# Patient Record
Sex: Male | Born: 1950 | Race: White | Hispanic: No | Smoking: Current every day smoker
Health system: Southern US, Community
[De-identification: ages and names within clinical notes are randomized; demographics above are authoritative.]

## PROBLEM LIST (undated history)

## (undated) DIAGNOSIS — K921 Melena: Secondary | ICD-10-CM

## (undated) DIAGNOSIS — R51 Headache: Secondary | ICD-10-CM

## (undated) DIAGNOSIS — M545 Low back pain, unspecified: Secondary | ICD-10-CM

## (undated) DIAGNOSIS — I82C19 Acute embolism and thrombosis of unspecified internal jugular vein: Secondary | ICD-10-CM

## (undated) DIAGNOSIS — C4492 Squamous cell carcinoma of skin, unspecified: Secondary | ICD-10-CM

## (undated) DIAGNOSIS — K279 Peptic ulcer, site unspecified, unspecified as acute or chronic, without hemorrhage or perforation: Secondary | ICD-10-CM

## (undated) DIAGNOSIS — J189 Pneumonia, unspecified organism: Secondary | ICD-10-CM

## (undated) DIAGNOSIS — G8929 Other chronic pain: Secondary | ICD-10-CM

## (undated) DIAGNOSIS — K219 Gastro-esophageal reflux disease without esophagitis: Secondary | ICD-10-CM

## (undated) DIAGNOSIS — R011 Cardiac murmur, unspecified: Secondary | ICD-10-CM

## (undated) DIAGNOSIS — F329 Major depressive disorder, single episode, unspecified: Secondary | ICD-10-CM

## (undated) DIAGNOSIS — M1990 Unspecified osteoarthritis, unspecified site: Secondary | ICD-10-CM

## (undated) DIAGNOSIS — R519 Headache, unspecified: Secondary | ICD-10-CM

## (undated) DIAGNOSIS — F32A Depression, unspecified: Secondary | ICD-10-CM

## (undated) DIAGNOSIS — H9313 Tinnitus, bilateral: Secondary | ICD-10-CM

## (undated) DIAGNOSIS — F419 Anxiety disorder, unspecified: Secondary | ICD-10-CM

## (undated) HISTORY — PX: CATARACT EXTRACTION W/ INTRAOCULAR LENS IMPLANT: SHX1309

## (undated) HISTORY — DX: Peptic ulcer, site unspecified, unspecified as acute or chronic, without hemorrhage or perforation: K27.9

---

## 2017-12-18 HISTORY — PX: SKIN BIOPSY: SHX1

## 2018-11-29 ENCOUNTER — Encounter (HOSPITAL_COMMUNITY): Payer: Self-pay

## 2018-11-29 ENCOUNTER — Other Ambulatory Visit: Payer: Self-pay

## 2018-11-29 ENCOUNTER — Inpatient Hospital Stay (HOSPITAL_COMMUNITY)
Admission: EM | Admit: 2018-11-29 | Discharge: 2018-12-03 | DRG: 133 | Disposition: A | Discharge status: Home / Self Care | Payer: Medicare HMO | Attending: Internal Medicine | Admitting: Internal Medicine

## 2018-11-29 ENCOUNTER — Emergency Department (HOSPITAL_COMMUNITY): Payer: Medicare HMO

## 2018-11-29 DIAGNOSIS — F17210 Nicotine dependence, cigarettes, uncomplicated: Secondary | ICD-10-CM | POA: Diagnosis present

## 2018-11-29 DIAGNOSIS — Z791 Long term (current) use of non-steroidal anti-inflammatories (NSAID): Secondary | ICD-10-CM

## 2018-11-29 DIAGNOSIS — F419 Anxiety disorder, unspecified: Secondary | ICD-10-CM | POA: Diagnosis present

## 2018-11-29 DIAGNOSIS — Z7982 Long term (current) use of aspirin: Secondary | ICD-10-CM

## 2018-11-29 DIAGNOSIS — C760 Malignant neoplasm of head, face and neck: Principal | ICD-10-CM | POA: Diagnosis present

## 2018-11-29 DIAGNOSIS — I82C11 Acute embolism and thrombosis of right internal jugular vein: Secondary | ICD-10-CM | POA: Diagnosis present

## 2018-11-29 DIAGNOSIS — R221 Localized swelling, mass and lump, neck: Secondary | ICD-10-CM | POA: Diagnosis present

## 2018-11-29 DIAGNOSIS — Z716 Tobacco abuse counseling: Secondary | ICD-10-CM

## 2018-11-29 DIAGNOSIS — F411 Generalized anxiety disorder: Secondary | ICD-10-CM | POA: Diagnosis present

## 2018-11-29 DIAGNOSIS — F329 Major depressive disorder, single episode, unspecified: Secondary | ICD-10-CM | POA: Diagnosis present

## 2018-11-29 DIAGNOSIS — Z720 Tobacco use: Secondary | ICD-10-CM | POA: Diagnosis present

## 2018-11-29 DIAGNOSIS — E876 Hypokalemia: Secondary | ICD-10-CM | POA: Diagnosis present

## 2018-11-29 DIAGNOSIS — R011 Cardiac murmur, unspecified: Secondary | ICD-10-CM | POA: Diagnosis present

## 2018-11-29 HISTORY — DX: Depression, unspecified: F32.A

## 2018-11-29 HISTORY — DX: Anxiety disorder, unspecified: F41.9

## 2018-11-29 HISTORY — DX: Major depressive disorder, single episode, unspecified: F32.9

## 2018-11-29 HISTORY — DX: Cardiac murmur, unspecified: R01.1

## 2018-11-29 LAB — CBC WITH DIFFERENTIAL/PLATELET
Abs Immature Granulocytes: 0.04 10*3/uL (ref 0.00–0.07)
Basophils Absolute: 0 10*3/uL (ref 0.0–0.1)
Basophils Relative: 0 %
Eosinophils Absolute: 0.5 10*3/uL (ref 0.0–0.5)
Eosinophils Relative: 4 %
HCT: 49.5 % (ref 39.0–52.0)
Hemoglobin: 16.3 g/dL (ref 13.0–17.0)
Immature Granulocytes: 0 %
Lymphocytes Relative: 22 %
Lymphs Abs: 2.6 10*3/uL (ref 0.7–4.0)
MCH: 29.5 pg (ref 26.0–34.0)
MCHC: 32.9 g/dL (ref 30.0–36.0)
MCV: 89.5 fL (ref 80.0–100.0)
Monocytes Absolute: 0.9 10*3/uL (ref 0.1–1.0)
Monocytes Relative: 8 %
Neutro Abs: 7.7 10*3/uL (ref 1.7–7.7)
Neutrophils Relative %: 66 %
PLATELETS: 319 10*3/uL (ref 150–400)
RBC: 5.53 MIL/uL (ref 4.22–5.81)
RDW: 13.5 % (ref 11.5–15.5)
WBC: 11.8 10*3/uL — AB (ref 4.0–10.5)
nRBC: 0 % (ref 0.0–0.2)

## 2018-11-29 LAB — BASIC METABOLIC PANEL
Anion gap: 11 (ref 5–15)
BUN: 13 mg/dL (ref 8–23)
CO2: 25 mmol/L (ref 22–32)
Calcium: 9.3 mg/dL (ref 8.9–10.3)
Chloride: 101 mmol/L (ref 98–111)
Creatinine, Ser: 1 mg/dL (ref 0.61–1.24)
GFR calc Af Amer: >60 mL/min (ref >60)
GFR calc non Af Amer: >60 mL/min (ref >60)
Glucose, Bld: 96 mg/dL (ref 70–99)
Potassium: 3.4 mmol/L — ABNORMAL LOW (ref 3.5–5.1)
Sodium: 137 mmol/L (ref 135–145)

## 2018-11-29 MED ORDER — IOPAMIDOL (ISOVUE-300) INJECTION 61%
75.0000 mL | Freq: Once | INTRAVENOUS | Status: AC | PRN
  Administered 2018-11-29: 75 mL via INTRAVENOUS

## 2018-11-29 NOTE — ED Triage Notes (Signed)
Pt reports "cleaning his ears with peroxide" about 3 weeks ago, afterwards started having right ear pain, lymph nodes below ear are swollen.

## 2018-11-29 NOTE — ED Provider Notes (Signed)
[facility] EMERGENCY DEPARTMENT Provider Note   CSN: [id number] Arrival date & time: 11/29/18  1922     History   Chief Complaint Chief Complaint  Patient presents with  . Otalgia    right lymphnode swollen    HPI Miguel Moreno is a 67 y.o. male.  HPI Patient presents with 3 weeks of right sided submandibular mass.  States the mass is tender to palpation.  Believes it may be related to cleaning his ear with peroxide several weeks ago.  He denies any fever or chills.  Denies shortness of breath or difficulty swallowing.  States he still feels like he has fluid in his ear but denies any pain. Past Medical History:  Diagnosis Date  . Anxiety and depression   . Heart murmur     Patient Active Problem List   Diagnosis Date Noted  . Neck mass 11/30/2018  . Tobacco abuse 11/30/2018  . Anxiety state 11/30/2018    Past Surgical History:  Procedure Laterality Date  . CATARACT EXTRACTION          Home Medications    Prior to Admission medications   Medication Sig Start Date End Date Taking? Authorizing Provider  aspirin EC 81 MG tablet Take 81 mg by mouth daily.   Yes [provider]  naproxen sodium (ALEVE) 220 MG tablet Take 220 mg by mouth.   Yes [provider]    Family History No family history on file.  Social History Social History   Tobacco Use  . Smoking status: Current Every Day Smoker    Packs/day: 1.00    Years: 20.00    Pack years: 20.00    Types: Cigarettes  . Smokeless tobacco: Never Used  Substance Use Topics  . Alcohol use: Never    Frequency: Never  . Drug use: Never     Allergies   Patient has no known allergies.   Review of Systems Review of Systems  Constitutional: Negative for chills and fever.  HENT: Negative for facial swelling, sore throat, trouble swallowing and voice change.   Respiratory: Negative for cough, shortness of breath, wheezing and stridor.   Musculoskeletal: Positive for neck pain. Negative  for neck stiffness.  Skin: Negative for rash and wound.  Neurological: Negative for weakness, numbness and headaches.  All other systems reviewed and are negative.    Physical Exam Updated Vital Signs BP 119/69 (BP Location: Left Arm)   Pulse 67   Temp 97.7 F (36.5 C) (Oral)   Resp 11   Ht 5\' 4"  (1.626 m)   Wt 75.2 kg   SpO2 97%   BMI 28.46 kg/m   Physical Exam Vitals signs and nursing note reviewed.  Constitutional:      General: He is not in acute distress.    Appearance: Normal appearance. He is well-developed and normal weight. He is not ill-appearing.  HENT:     Head: Normocephalic and atraumatic.     Right Ear: External ear normal. There is no impacted cerumen.     Left Ear: External ear normal. There is no impacted cerumen.     Ears:     Comments: Normal pinna, external canals.  Mild bulging of TMs bilaterally.  No posterior auricular tenderness to palpation. Eyes:     Pupils: Pupils are equal, round, and reactive to light.  Neck:     Musculoskeletal: Normal range of motion and neck supple.     Comments: Right submandibular mass noted just anterior to the angle of  the mandible.  No fluctuance.  No erythema.  Mild tenderness to palpation. Cardiovascular:     Rate and Rhythm: Normal rate and regular rhythm.  Pulmonary:     Effort: Pulmonary effort is normal.     Breath sounds: Normal breath sounds.  Abdominal:     General: Bowel sounds are normal.     Palpations: Abdomen is soft.     Tenderness: There is no abdominal tenderness. There is no guarding or rebound.  Musculoskeletal: Normal range of motion.        General: No tenderness.  Skin:    General: Skin is warm and dry.     Findings: No erythema or rash.  Neurological:     Mental Status: He is alert and oriented to person, place, and time.  Psychiatric:        Behavior: Behavior normal.      ED Treatments / Results  Labs (all labs ordered are listed, but only abnormal results are displayed) Labs  Reviewed  BASIC METABOLIC PANEL - Abnormal; Notable for the following components:      Result Value   Potassium 3.4 (*)    All other components within normal limits  CBC WITH DIFFERENTIAL/PLATELET - Abnormal; Notable for the following components:   WBC 11.8 (*)    All other components within normal limits  APTT - Abnormal; Notable for the following components:   aPTT 39 (*)    All other components within normal limits  CBC - Abnormal; Notable for the following components:   WBC 11.7 (*)    All other components within normal limits  COMPREHENSIVE METABOLIC PANEL - Abnormal; Notable for the following components:   Calcium 8.7 (*)    AST 14 (*)    All other components within normal limits  PROTIME-INR  HEPARIN LEVEL (UNFRACTIONATED)  HIV ANTIBODY (ROUTINE TESTING W REFLEX)  HEPARIN LEVEL (UNFRACTIONATED)  HEPARIN LEVEL (UNFRACTIONATED)  CBC    EKG None  Radiology Ct Soft Tissue Neck W Contrast  Result Date: 11/29/2018 CLINICAL DATA:  RIGHT ear pain and RIGHT lymphadenopathy after cleaning ear with peroxide 3 weeks ago. EXAM: CT NECK WITH CONTRAST TECHNIQUE: Multidetector CT imaging of the neck was performed using the standard protocol following the bolus administration of intravenous contrast. CONTRAST:  68mL ISOVUE-300 IOPAMIDOL (ISOVUE-300) INJECTION 61% COMPARISON:  None. FINDINGS: PHARYNX AND LARYNX: Isodense 17 mm nodule suspected RIGHT palatine tonsil (axial image 38, coronal image 49). Widely patent airway. SALIVARY GLANDS: Normal. THYROID: Subcentimeter calcification RIGHT thyroid lobe, thyroid is otherwise unremarkable. LYMPH NODES: RIGHT level 2 necrotic/cystic nodal conglomeration measuring to 3.4 x 3.9 cm, Dominant lymph node measuring 2.2 x 3.1 cm. LEFT level 2 B cystic 11 mm and LEFT level 3 12 mm lymph nodes. Additional smaller LEFT neck lymph nodes. VASCULAR: RIGHT internal jugular vein distension with central filling defect at level of mid neck associated with  lymphadenopathy. Mild calcific atherosclerosis carotid bifurcations. LIMITED INTRACRANIAL: Normal. VISUALIZED ORBITS: LEFT ocular lens implant. MASTOIDS AND VISUALIZED PARANASAL SINUSES: Mild paranasal sinus mucosal thickening without air-fluid levels. SKELETON: Nonacute. Patient is edentulous. Mild RIGHT temporomandibular osteoarthrosis. Severe lower cervical spondylosis. Moderate cervical facet arthropathy. UPPER CHEST: Lung apices are clear. Centrilobular emphysema. No superior mediastinal lymphadenopathy. OTHER: None. IMPRESSION: 1. Cystic/necrotic RIGHT > LEFT neck lymphadenopathy consistent with metastatic disease. 2. 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 Recommend ENT consultation. 3. Acute RIGHT internal jugular vein thrombosis related to lymphadenopathy. 4. Acute findings discussed with and  reconfirmed by Dr.Doniesha Landau on 11/29/2018 at 10:18 pm. Aortic Atherosclerosis (ICD10-I70.0). Electronically Signed   By: Elon Alas M.D.   On: 11/29/2018 22:18    Procedures Procedures (including critical care time)  Medications Ordered in ED Medications  0.9 %  sodium chloride infusion (has no administration in time range)  acetaminophen (TYLENOL) tablet 650 mg (650 mg Oral Given 12/01/18 0839)    Or  acetaminophen (TYLENOL) suppository 650 mg ( Rectal See Alternative 12/01/18 0839)  ondansetron (ZOFRAN) tablet 4 mg (has no administration in time range)    Or  ondansetron (ZOFRAN) injection 4 mg (has no administration in time range)  heparin ADULT infusion 100 units/mL (25000 units/229mL sodium chloride 0.45%) (1,250 Units/hr Intravenous New Bag/Given 12/01/18 1503)  feeding supplement (ENSURE ENLIVE) (ENSURE ENLIVE) liquid 237 mL (237 mLs Oral Given 11/30/18 2034)  multivitamin liquid 15 mL (15 mLs Oral Given 12/01/18 0839)  iopamidol (ISOVUE-300) 61 % injection 75 mL (75 mLs Intravenous Contrast Given 11/29/18 2145)    potassium chloride SA (K-DUR,KLOR-CON) CR tablet 20 mEq (20 mEq Oral Given 11/30/18 0023)  heparin bolus via infusion 4,000 Units (4,000 Units Intravenous Bolus from Bag 11/30/18 0104)     Initial Impression / Assessment and Plan / ED Course  I have reviewed the triage vital signs and the nursing notes.  Pertinent labs & imaging results that were available during my care of the patient were reviewed by me and considered in my medical decision making (see chart for details).    Abnormal lymph nodes on CT.  Patient also has an internal jugular thrombus on the right.  Discussed with oncology who recommend starting heparin and admission for possible biopsy.  Hospitalist will admit and transfer to [facility].   Final Clinical Impressions(s) / ED Diagnoses   Final diagnoses:  Neck mass    ED Discharge Orders    None       Julianne Rice, MD 12/01/18 1527

## 2018-11-30 DIAGNOSIS — Z716 Tobacco abuse counseling: Secondary | ICD-10-CM | POA: Diagnosis not present

## 2018-11-30 DIAGNOSIS — Z791 Long term (current) use of non-steroidal anti-inflammatories (NSAID): Secondary | ICD-10-CM | POA: Diagnosis not present

## 2018-11-30 DIAGNOSIS — E876 Hypokalemia: Secondary | ICD-10-CM | POA: Diagnosis not present

## 2018-11-30 DIAGNOSIS — I8290 Acute embolism and thrombosis of unspecified vein: Secondary | ICD-10-CM

## 2018-11-30 DIAGNOSIS — F411 Generalized anxiety disorder: Secondary | ICD-10-CM | POA: Diagnosis present

## 2018-11-30 DIAGNOSIS — F329 Major depressive disorder, single episode, unspecified: Secondary | ICD-10-CM | POA: Diagnosis not present

## 2018-11-30 DIAGNOSIS — R221 Localized swelling, mass and lump, neck: Secondary | ICD-10-CM | POA: Diagnosis not present

## 2018-11-30 DIAGNOSIS — F17210 Nicotine dependence, cigarettes, uncomplicated: Secondary | ICD-10-CM | POA: Diagnosis not present

## 2018-11-30 DIAGNOSIS — F419 Anxiety disorder, unspecified: Secondary | ICD-10-CM | POA: Diagnosis not present

## 2018-11-30 DIAGNOSIS — I82C11 Acute embolism and thrombosis of right internal jugular vein: Secondary | ICD-10-CM | POA: Diagnosis not present

## 2018-11-30 DIAGNOSIS — Z720 Tobacco use: Secondary | ICD-10-CM | POA: Diagnosis present

## 2018-11-30 DIAGNOSIS — C760 Malignant neoplasm of head, face and neck: Secondary | ICD-10-CM | POA: Diagnosis present

## 2018-11-30 DIAGNOSIS — Z7982 Long term (current) use of aspirin: Secondary | ICD-10-CM | POA: Diagnosis not present

## 2018-11-30 DIAGNOSIS — R011 Cardiac murmur, unspecified: Secondary | ICD-10-CM | POA: Diagnosis not present

## 2018-11-30 LAB — HEPARIN LEVEL (UNFRACTIONATED)
Heparin Unfractionated: 0.65 IU/mL (ref 0.30–0.70)
Heparin Unfractionated: 0.54 IU/mL (ref 0.30–0.70)

## 2018-11-30 LAB — COMPREHENSIVE METABOLIC PANEL
ALT: 10 U/L (ref 0–44)
AST: 14 U/L — AB (ref 15–41)
Albumin: 3.7 g/dL (ref 3.5–5.0)
Alkaline Phosphatase: 56 U/L (ref 38–126)
Anion gap: 11 (ref 5–15)
BUN: 13 mg/dL (ref 8–23)
CO2: 23 mmol/L (ref 22–32)
Calcium: 8.7 mg/dL — ABNORMAL LOW (ref 8.9–10.3)
Chloride: 104 mmol/L (ref 98–111)
Creatinine, Ser: 0.96 mg/dL (ref 0.61–1.24)
GFR calc Af Amer: >60 mL/min (ref >60)
GFR calc non Af Amer: >60 mL/min (ref >60)
Glucose, Bld: 95 mg/dL (ref 70–99)
Potassium: 4.2 mmol/L (ref 3.5–5.1)
Sodium: 138 mmol/L (ref 135–145)
Total Bilirubin: 1 mg/dL (ref 0.3–1.2)
Total Protein: 6.6 g/dL (ref 6.5–8.1)

## 2018-11-30 LAB — CBC
HCT: 44 % (ref 39.0–52.0)
Hemoglobin: 14.8 g/dL (ref 13.0–17.0)
MCH: 29.7 pg (ref 26.0–34.0)
MCHC: 33.6 g/dL (ref 30.0–36.0)
MCV: 88.2 fL (ref 80.0–100.0)
Platelets: 272 10*3/uL (ref 150–400)
RBC: 4.99 MIL/uL (ref 4.22–5.81)
RDW: 13.4 % (ref 11.5–15.5)
WBC: 11.7 10*3/uL — ABNORMAL HIGH (ref 4.0–10.5)
nRBC: 0 % (ref 0.0–0.2)

## 2018-11-30 LAB — APTT: aPTT: 39 seconds — ABNORMAL HIGH (ref 24–36)

## 2018-11-30 LAB — PROTIME-INR
INR: 1
PROTHROMBIN TIME: 13.1 s (ref 11.4–15.2)

## 2018-11-30 LAB — HIV ANTIBODY (ROUTINE TESTING W REFLEX): HIV Screen 4th Generation wRfx: NONREACTIVE

## 2018-11-30 MED ORDER — ADULT MULTIVITAMIN LIQUID CH
15.0000 mL | Freq: Every day | ORAL | Status: DC
  Administered 2018-11-30 – 2018-12-01 (×2): 15 mL via ORAL
  Filled 2018-11-30 (×4): qty 15

## 2018-11-30 MED ORDER — POTASSIUM CHLORIDE CRYS ER 20 MEQ PO TBCR
20.0000 meq | EXTENDED_RELEASE_TABLET | Freq: Once | ORAL | Status: AC
  Administered 2018-11-30: 20 meq via ORAL
  Filled 2018-11-30: qty 1

## 2018-11-30 MED ORDER — HEPARIN (PORCINE) 25000 UT/250ML-% IV SOLN
1250.0000 [IU]/h | INTRAVENOUS | Status: AC
  Administered 2018-11-30 – 2018-12-02 (×4): 1250 [IU]/h via INTRAVENOUS
  Filled 2018-11-30 (×6): qty 250

## 2018-11-30 MED ORDER — ACETAMINOPHEN 650 MG RE SUPP: 650.0000 mg | Freq: Four times a day (QID) | RECTAL | Status: DC | PRN

## 2018-11-30 MED ORDER — ONDANSETRON HCL 4 MG PO TABS: 4.0000 mg | ORAL_TABLET | Freq: Four times a day (QID) | ORAL | Status: DC | PRN

## 2018-11-30 MED ORDER — SODIUM CHLORIDE 0.9 % IV SOLN: INTRAVENOUS | Status: DC

## 2018-11-30 MED ORDER — ONDANSETRON HCL 4 MG/2ML IJ SOLN: 4.0000 mg | Freq: Four times a day (QID) | INTRAMUSCULAR | Status: DC | PRN

## 2018-11-30 MED ORDER — HEPARIN BOLUS VIA INFUSION
4000.0000 [IU] | Freq: Once | INTRAVENOUS | Status: AC
  Administered 2018-11-30: 4000 [IU] via INTRAVENOUS

## 2018-11-30 MED ORDER — ACETAMINOPHEN 325 MG PO TABS
650.0000 mg | ORAL_TABLET | Freq: Four times a day (QID) | ORAL | Status: DC | PRN
  Administered 2018-11-30 – 2018-12-03 (×6): 650 mg via ORAL
  Filled 2018-11-30 (×6): qty 2

## 2018-11-30 MED ORDER — ENSURE ENLIVE PO LIQD
237.0000 mL | ORAL | Status: DC
  Administered 2018-11-30 – 2018-12-02 (×3): 237 mL via ORAL

## 2018-11-30 NOTE — Progress Notes (Signed)
Initial Nutrition Assessment  DOCUMENTATION CODES:  Not applicable  INTERVENTION:  Ensure Enlive Q 24 hrs, each supplement provides 350 kcal and 20 grams of protein   MVI with minerals-liquid  NUTRITION DIAGNOSIS:  Increased nutrient needs related to cancer and cancer related treatments as evidenced by estimated nutritional requirements for this condition  GOAL:  Patient will meet greater than or equal to 90% of their needs   MONITOR:  PO intake, Supplement acceptance, Labs, I & O's, Weight trends   REASON FOR ASSESSMENT:  Malnutrition Screening Tool    ASSESSMENT:  67 y/o male PMhx anxiety, depression. Prsented to [facility] with R side neck mass w/ odynophagia. CT scan revealed cystic/necrotic adenopathy of neck consistent w/ metastatic disease suspicious for primary H&N cancer. Transferred to [facility] for biopsy of mass.   Pt reports that he developed symptoms roughly 2 weeks ago. These included ear pain and some mild odynophagia, the latter of which he says has resolved. He denies any changes in intake related to these symptoms.   At baseline, he does not follow any therapeutic diet. He does avoid spicy foods for reflux reasons. He does not drink oral supplements. He used to take a mvi, but stopped several months ago. He says he needs one because he can feel that his vitamin levels are low. He asks for a liquid instead of a pill because he believes these work quicker  He reported on MST screen that he had lost a large amount of weight. However, today he says his weight loss is in the distant past and says his wt has been roughly stable for the past 2 years. He says he may even be up ~5 lbs recently.   Currently, pt reports a good appetite. His dinner tray at bedside is 95% eaten. Given heightened needs in setting of malignancy, RD recommend a protein supplement. He agreed to Ensure. Will order.   Labs: wbc:11.7 Meds: IVF  Recent Labs  Lab 11/29/18 2012 11/30/18 0728  NA 137 138  K  3.4* 4.2  CL 101 104  CO2 25 23  BUN 13 13  CREATININE 1.00 0.96  CALCIUM 9.3 8.7*  GLUCOSE 96 95   NUTRITION - FOCUSED PHYSICAL EXAM:   Most Recent Value  Orbital Region  No depletion  Upper Arm Region  No depletion  Thoracic and Lumbar Region  No depletion  Buccal Region  No depletion  Temple Region  No depletion  Clavicle Bone Region  No depletion  Clavicle and Acromion Bone Region  No depletion  Scapular Bone Region  Unable to assess  Dorsal Hand  No depletion  Patellar Region  No depletion  Anterior Thigh Region  No depletion  Posterior Calf Region  No depletion  Edema (RD Assessment)  None  Hair  Reviewed  Eyes  Reviewed  Mouth  Reviewed  Skin  Reviewed  Nails  Reviewed       Diet Order:   Diet Order            Diet regular Room service appropriate? Yes; Fluid consistency: Thin  Diet effective now             EDUCATION NEEDS:  Not appropriate for education at this time  Skin:  Skin Assessment: Reviewed RN Assessment  Last BM:  11/29/2018  Height:  Ht Readings from Last 1 Encounters:  11/29/18 5\' 4"  (1.626 m)   Weight:  Wt Readings from Last 1 Encounters:  11/30/18 75.2 kg   Ideal Body Weight:  59.1 kg  BMI:  Body mass index is 28.46 kg/m.  Estimated Nutritional Needs:  Kcal:  1900-2050 (25-27 kcal/kg bw) Protein:  90-105g Pro (1.2-1.4 g/kg bw) Fluid:  1.9-2.1 L fluid (77ml/kca)  Burtis Junes RD, LDN, CNSC Clinical Nutrition Available Tues-Sat via Pager: [phone] 11/30/2018 6:45 PM

## 2018-11-30 NOTE — Progress Notes (Signed)
Patient is NPO. MD was notified and asked writer to contact IR.  Attempt x4 to contact IR was unsuccessful, phone rings and goes to voicemail. Patient is not on the schedule for any procedure at this time. Regular diet initiated.

## 2018-11-30 NOTE — Plan of Care (Signed)
  Problem: Education: Goal: Knowledge of General Education information will improve Description: Including pain rating scale, medication(s)/side effects and non-pharmacologic comfort measures Outcome: Progressing   Problem: Pain Managment: Goal: General experience of comfort will improve Outcome: Progressing   Problem: Safety: Goal: Ability to remain free from injury will improve Outcome: Progressing   

## 2018-11-30 NOTE — H&P (Signed)
[facility] H&P    Patient Demographics:    Miguel Moreno, is a 67 y.o. male  MRN: [id number]  DOB - [birth year]  Admit Date - 11/29/2018  Referring MD/NP/PA: Dr. Lita Mains  Outpatient Primary MD for the patient is Patient, No Pcp Per  Patient coming from: Home  Chief complaint-neck swelling   HPI:    Miguel Moreno  is a 67 y.o. male, with history of anxiety and depression, heart murmur came to the hospital with complaints of right-sided neck mass.  Patient says that he has experienced pain during swallowing.  Denies shortness of breath.  No fever or chills.  No nausea vomiting or diarrhea. In the ED CT scan of the neck showed cystic/necrotic right more than left neck lymphadenopathy consistent with metastatic disease.  17 mm right palatine tonsil submucosal mass, highly suspicious for primary head and neck cancer. Oncology, Dr. Marin Olp was consulted by ED physician He recommended patient was started on IV heparin and transferred to [facility] for biopsy of neck mass.    Review of systems:    In addition to the HPI above,   All other systems reviewed and are negative.    Past History of the following :    Past Medical History:  Diagnosis Date  . Anxiety and depression   . Heart murmur       Past Surgical History:  Procedure Laterality Date  . CATARACT EXTRACTION        Social History:      Social History   Tobacco Use  . Smoking status: Current Every Day Smoker    Packs/day: 1.00    Years: 20.00    Pack years: 20.00    Types: Cigarettes  . Smokeless tobacco: Never Used  Substance Use Topics  . Alcohol use: Never    Frequency: Never       Family History :   No family history of cancer   Home Medications:   Prior to Admission medications   Medication Sig Start Date End Date Taking? Authorizing Provider  aspirin EC 81 MG tablet Take 81 mg by mouth daily.   Yes [provider]  naproxen sodium (ALEVE) 220 MG tablet Take 220 mg by mouth.   Yes [provider]     Allergies:    No Known Allergies   Physical Exam:   Vitals  Blood pressure 140/72, pulse 82, temperature 98.1 F (36.7 C), temperature source Oral, resp. rate 20, height 5\' 4"  (1.626 m), weight 78.5 kg, SpO2 97 %.  1.  General: Appears in no acute distress  2. Psychiatric:  Intact judgement and  insight, awake alert, oriented x 3.  3. Neurologic: No focal neurological deficits, all cranial nerves intact.Strength 5/5 all 4 extremities, sensation intact all 4 extremities, plantars down going.  4. Eyes :  anicteric sclerae, moist conjunctivae with no lid lag. PERRLA.  5. ENMT:  Oropharynx clear with moist mucous membranes and good dentition  6. Neck:  supple, large mass noted at the angle of right jaw, hard to palpation, fixed,  nontender to palpation  7. Respiratory : Normal respiratory effort, good air movement bilaterally,clear to  auscultation bilaterally  8. Cardiovascular : RRR, no gallops, rubs or murmurs, no leg edema  9. Gastrointestinal:  Positive bowel sounds, abdomen soft, non-tender to palpation,no hepatosplenomegaly, no rigidity or guarding       10. Skin:  No cyanosis, normal texture and turgor, no rash, lesions or ulcers  11.Musculoskeletal:  Good muscle tone,  joints appear normal , no effusions,  normal range of motion    Data Review:    CBC Recent Labs  Lab 11/29/18 2012  WBC 11.8*  HGB 16.3  HCT 49.5  PLT 319  MCV 89.5  MCH 29.5  MCHC 32.9  RDW 13.5  LYMPHSABS 2.6  MONOABS 0.9  EOSABS 0.5  BASOSABS 0.0   ------------------------------------------------------------------------------------------------------------------  Results for orders placed or performed during the hospital encounter of 11/29/18 (from the past 48 hour(s))  Basic metabolic panel     Status: Abnormal   Collection Time: 11/29/18  8:12 PM  Result Value Ref Range    Sodium 137 135 - 145 mmol/L   Potassium 3.4 (L) 3.5 - 5.1 mmol/L   Chloride 101 98 - 111 mmol/L   CO2 25 22 - 32 mmol/L   Glucose, Bld 96 70 - 99 mg/dL   BUN 13 8 - 23 mg/dL   Creatinine, Ser 1.00 0.61 - 1.24 mg/dL   Calcium 9.3 8.9 - 10.3 mg/dL   GFR calc non Af Amer >60 >60 mL/min   GFR calc Af Amer >60 >60 mL/min   Anion gap 11 5 - 15    Comment: Performed at [facility], [address] [place], [place] [address]  CBC with Differential/Platelet     Status: Abnormal   Collection Time: 11/29/18  8:12 PM  Result Value Ref Range   WBC 11.8 (H) 4.0 - 10.5 K/uL   RBC 5.53 4.22 - 5.81 MIL/uL   Hemoglobin 16.3 13.0 - 17.0 g/dL   HCT 49.5 39.0 - 52.0 %   MCV 89.5 80.0 - 100.0 fL   MCH 29.5 26.0 - 34.0 pg   MCHC 32.9 30.0 - 36.0 g/dL   RDW 13.5 11.5 - 15.5 %   Platelets 319 150 - 400 K/uL   nRBC 0.0 0.0 - 0.2 %   Neutrophils Relative % 66 %   Neutro Abs 7.7 1.7 - 7.7 K/uL   Lymphocytes Relative 22 %   Lymphs Abs 2.6 0.7 - 4.0 K/uL   Monocytes Relative 8 %   Monocytes Absolute 0.9 0.1 - 1.0 K/uL   Eosinophils Relative 4 %   Eosinophils Absolute 0.5 0.0 - 0.5 K/uL   Basophils Relative 0 %   Basophils Absolute 0.0 0.0 - 0.1 K/uL   Immature Granulocytes 0 %   Abs Immature Granulocytes 0.04 0.00 - 0.07 K/uL    Comment: Performed at [facility], [address] [place], [place] [address]    Chemistries  Recent Labs  Lab 11/29/18 2012  NA 137  K 3.4*  CL 101  CO2 25  GLUCOSE 96  BUN 13  CREATININE 1.00  CALCIUM 9.3   ------------------------------------------------------------------------------------------------------------------  ------------------------------------------------------------------------------------------------------------------    Imaging Results:    Ct Soft Tissue Neck W Contrast  Result Date: 11/29/2018 CLINICAL DATA:  RIGHT ear pain and RIGHT lymphadenopathy after cleaning ear with peroxide 3 weeks ago. EXAM: CT NECK WITH CONTRAST TECHNIQUE:  Multidetector CT imaging of the neck was performed using the standard protocol following the bolus administration  of intravenous contrast. CONTRAST:  14mL ISOVUE-300 IOPAMIDOL (ISOVUE-300) INJECTION 61% COMPARISON:  None. FINDINGS: PHARYNX AND LARYNX: Isodense 17 mm nodule suspected RIGHT palatine tonsil (axial image 38, coronal image 49). Widely patent airway. SALIVARY GLANDS: Normal. THYROID: Subcentimeter calcification RIGHT thyroid lobe, thyroid is otherwise unremarkable. LYMPH NODES: RIGHT level 2 necrotic/cystic nodal conglomeration measuring to 3.4 x 3.9 cm, Dominant lymph node measuring 2.2 x 3.1 cm. LEFT level 2 B cystic 11 mm and LEFT level 3 12 mm lymph nodes. Additional smaller LEFT neck lymph nodes. VASCULAR: RIGHT internal jugular vein distension with central filling defect at level of mid neck associated with lymphadenopathy. Mild calcific atherosclerosis carotid bifurcations. LIMITED INTRACRANIAL: Normal. VISUALIZED ORBITS: LEFT ocular lens implant. MASTOIDS AND VISUALIZED PARANASAL SINUSES: Mild paranasal sinus mucosal thickening without air-fluid levels. SKELETON: Nonacute. Patient is edentulous. Mild RIGHT temporomandibular osteoarthrosis. Severe lower cervical spondylosis. Moderate cervical facet arthropathy. UPPER CHEST: Lung apices are clear. Centrilobular emphysema. No superior mediastinal lymphadenopathy. OTHER: None. IMPRESSION: 1. Cystic/necrotic RIGHT > LEFT neck lymphadenopathy consistent with metastatic disease. 2. 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 Recommend ENT consultation. 3. Acute RIGHT internal jugular vein thrombosis related to lymphadenopathy. 4. Acute findings discussed with and reconfirmed by Dr.DAVID YELVERTON on 11/29/2018 at 10:18 pm. Aortic Atherosclerosis (ICD10-I70.0). Electronically Signed   By: Elon Alas M.D.   On: 11/29/2018 22:18     Assessment & Plan:    Active Problems:   Neck  mass   1. Neck mass-likely metastatic disease as per CT scan.  Oncology, Dr. Marin Olp to see patient in a.m.  Will order IR for biopsy of the mass.  2. Internal jugular vein thrombosis-seen on CT scan, will start IV heparin.  Might need to be switched to Lovenox at the time of discharge.  3. Hypokalemia-potassium was 3.4, replace potassium and check BMP in a.m.   Patient will be transferred to [facility], Dr. Hal Hope has accepted the patient at [facility]  DVT Prophylaxis-   Heparin  AM Labs Ordered, also please review Full Orders  Family Communication: Admission, patients condition and plan of care including tests being ordered have been discussed with the patient  who indicate understanding and agree with the plan and Code Status.  Code Status: Full code  Admission status: Observation: Based on patients clinical presentation and evaluation of above clinical data, I have made determination that patient will need less than 2 midnights in the hospital.  Time spent in minutes : .  60 minutes   Oswald Hillock M.D on 11/30/2018 at 12:08 AM  Between 7am to 7pm - Pager - [phone]. After 7pm go to [email or web address] - password [facility]   [facility] - Office  [phone]

## 2018-11-30 NOTE — Progress Notes (Addendum)
ANTICOAGULATION CONSULT NOTE  Pharmacy Consult for Heparin Indication: DVT treatment  No Known Allergies  Patient Measurements: Height: 5\' 4"  (162.6 cm) Weight: 165 lb 12.6 oz (75.2 kg) IBW/kg (Calculated) : 59.2 HEPARIN DW (KG): 75.3   Vital Signs: Temp: 98 F (36.7 C) (12/14 0431) Temp Source: Oral (12/14 0431) BP: 117/72 (12/14 0431) Pulse Rate: 74 (12/14 0431)  Labs: Recent Labs    11/29/18 0010 11/29/18 2012 11/30/18 0728  HGB  --  16.3 14.8  HCT  --  49.5 44.0  PLT  --  319 272  APTT 39*  --   --   LABPROT 13.1  --   --   INR 1.00  --   --   HEPARINUNFRC  --   --  0.65  CREATININE  --  1.00 0.96   Estimated Creatinine Clearance: 67.4 mL/min (by C-G formula based on SCr of 0.96 mg/dL).  Medical History: Past Medical History:  Diagnosis Date  . Anxiety and depression   . Heart murmur     Medications:   Assessment: 67 yo male seen in the ED for left neck mass. CT shows internal jugular thrombosis. Pharmacy has been consulted for IV heparin dosing.  Heparin level therapeutic at 0.65 units/sml this am. CBC WNL.   Goal of Therapy:  Heparin goal level: 0.3-0.7 unit/sml Monitor platelets by anticoagulation protocol: Yes   Plan:  Continue heparin infusion at 1250 units/hr Heparin level in 6 hours Daily heparin level, CBC while on heparin.   Harrietta Guardian, PharmD PGY1 Pharmacy Resident 11/30/2018    8:45 AM Please check AMION for all [place] numbers  Addendum: -6hr confirmatory heparin level drawn 1hr early but within range at 0.54 Plan:  Continue heparin infusion at 1250 units/hr Daily heparin level, CBC while on heparin.   Harrietta Guardian, PharmD PGY1 Pharmacy Resident 11/30/2018    1:45 PM Please check AMION for all [place] numbers

## 2018-11-30 NOTE — Progress Notes (Signed)
Patient ID: Miguel Moreno, male   DOB: 1951/08/01, 67 y.o.   MRN: [id number]   PROGRESS NOTE    Miguel Moreno  UDJ:[id number] DOB: [birth year] DOA: 11/29/2018 PCP: Patient, No Pcp Per   Brief Narrative:   Miguel Moreno  is a 67 y.o. male, with history of anxiety and depression, heart murmur came to the hospital with complaints of right-sided neck mass.  Patient says that he has experienced pain during swallowing.  Denies shortness of breath.  No fever or chills.  No nausea vomiting or diarrhea. In the ED CT scan of the neck showed cystic/necrotic right more than left neck lymphadenopathy consistent with metastatic disease.  17 mm right palatine tonsil submucosal mass, highly suspicious for primary head and neck cancer. Oncology, Dr. Marin Olp was consulted by ED physician He recommended patient was started on IV heparin and transferred to [facility] for biopsy of neck mass.   Assessment & Plan:   Principal Problem:   Neck mass Active Problems:   Tobacco abuse   Anxiety state   #1 neck mass: Suspected malignancy.  Patient is to have fine-needle biopsy done on Monday.  IR could not do it over the weekend.  Continue supportive care.  #2 right internal jugular vein thrombosis: Patient currently on heparin drip.  We will keep in heparin drip until after biopsy.  Patient may then be transitioned to oral anticoagulants  #3 tobacco abuse: Counseling provided continue nicotine patch.  #4 anxiety state: Continue home regimen.   DVT prophylaxis: Heparin Code Status: Full code Family Communication: No family available Disposition Plan: Home when ready  Consultants:   Dr. Marin Olp of oncology  Procedures:   Fine-needle aspiration biopsy to be done  Antimicrobials:   None   Subjective: Patient is doing much better today.  He wants to be able to eat and drink.  No pain at the moment.  Denied any fever or chills.  Denied any neck swallowing problems.  Objective: Vitals:   11/30/18 0100  11/30/18 0220 11/30/18 0221 11/30/18 0431  BP: 111/72 (!) 104/92  117/72  Pulse: 96 84  74  Resp: 18 15  19   Temp:  98.1 F (36.7 C)  98 F (36.7 C)  TempSrc:  Oral  Oral  SpO2: 97% 98%  97%  Weight:   75.2 kg   Height:       No intake or output data in the 24 hours ending 11/30/18 0914 Filed Weights   11/29/18 1930 11/30/18 0221  Weight: 78.5 kg 75.2 kg    Examination:  General exam: Appears calm and comfortable  Respiratory system: Clear to auscultation. Respiratory effort normal. Cardiovascular system: S1 & S2 heard, RRR. No JVD, murmurs, rubs, gallops or clicks. No pedal edema. Gastrointestinal system: Abdomen is nondistended, soft and nontender. No organomegaly or masses felt. Normal bowel sounds heard. Central nervous system: Alert and oriented. No focal neurological deficits. Extremities: Symmetric 5 x 5 power. Skin: No rashes, lesions or ulcers Psychiatry: Judgement and insight appear normal. Mood & affect appropriate.     Data Reviewed: I have personally reviewed following labs and imaging studies  CBC: Recent Labs  Lab 11/29/18 2012 11/30/18 0728  WBC 11.8* 11.7*  NEUTROABS 7.7  --   HGB 16.3 14.8  HCT 49.5 44.0  MCV 89.5 88.2  PLT 319 588   Basic Metabolic Panel: Recent Labs  Lab 11/29/18 2012 11/30/18 0728  NA 137 138  K 3.4* 4.2  CL 101 104  CO2 25 23  GLUCOSE  96 95  BUN 13 13  CREATININE 1.00 0.96  CALCIUM 9.3 8.7*   GFR: Estimated Creatinine Clearance: 67.4 mL/min (by C-G formula based on SCr of 0.96 mg/dL). Liver Function Tests: Recent Labs  Lab 11/30/18 0728  AST 14*  ALT 10  ALKPHOS 56  BILITOT 1.0  PROT 6.6  ALBUMIN 3.7   No results for input(s): LIPASE, AMYLASE in the last 168 hours. No results for input(s): AMMONIA in the last 168 hours. Coagulation Profile: Recent Labs  Lab 11/29/18 0010  INR 1.00   Cardiac Enzymes: No results for input(s): CKTOTAL, CKMB, CKMBINDEX, TROPONINI in the last 168 hours. BNP (last 3  results) No results for input(s): PROBNP in the last 8760 hours. HbA1C: No results for input(s): HGBA1C in the last 72 hours. CBG: No results for input(s): GLUCAP in the last 168 hours. Lipid Profile: No results for input(s): CHOL, HDL, LDLCALC, TRIG, CHOLHDL, LDLDIRECT in the last 72 hours. Thyroid Function Tests: No results for input(s): TSH, T4TOTAL, FREET4, T3FREE, THYROIDAB in the last 72 hours. Anemia Panel: No results for input(s): VITAMINB12, FOLATE, FERRITIN, TIBC, IRON, RETICCTPCT in the last 72 hours. Sepsis Labs: No results for input(s): PROCALCITON, LATICACIDVEN in the last 168 hours.  No results found for this or any previous visit (from the past 240 hour(s)).       Radiology Studies: Ct Soft Tissue Neck W Contrast  Result Date: 11/29/2018 CLINICAL DATA:  RIGHT ear pain and RIGHT lymphadenopathy after cleaning ear with peroxide 3 weeks ago. EXAM: CT NECK WITH CONTRAST TECHNIQUE: Multidetector CT imaging of the neck was performed using the standard protocol following the bolus administration of intravenous contrast. CONTRAST:  11mL ISOVUE-300 IOPAMIDOL (ISOVUE-300) INJECTION 61% COMPARISON:  None. FINDINGS: PHARYNX AND LARYNX: Isodense 17 mm nodule suspected RIGHT palatine tonsil (axial image 38, coronal image 49). Widely patent airway. SALIVARY GLANDS: Normal. THYROID: Subcentimeter calcification RIGHT thyroid lobe, thyroid is otherwise unremarkable. LYMPH NODES: RIGHT level 2 necrotic/cystic nodal conglomeration measuring to 3.4 x 3.9 cm, Dominant lymph node measuring 2.2 x 3.1 cm. LEFT level 2 B cystic 11 mm and LEFT level 3 12 mm lymph nodes. Additional smaller LEFT neck lymph nodes. VASCULAR: RIGHT internal jugular vein distension with central filling defect at level of mid neck associated with lymphadenopathy. Mild calcific atherosclerosis carotid bifurcations. LIMITED INTRACRANIAL: Normal. VISUALIZED ORBITS: LEFT ocular lens implant. MASTOIDS AND VISUALIZED PARANASAL  SINUSES: Mild paranasal sinus mucosal thickening without air-fluid levels. SKELETON: Nonacute. Patient is edentulous. Mild RIGHT temporomandibular osteoarthrosis. Severe lower cervical spondylosis. Moderate cervical facet arthropathy. UPPER CHEST: Lung apices are clear. Centrilobular emphysema. No superior mediastinal lymphadenopathy. OTHER: None. IMPRESSION: 1. Cystic/necrotic RIGHT > LEFT neck lymphadenopathy consistent with metastatic disease. 2. 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 Recommend ENT consultation. 3. Acute RIGHT internal jugular vein thrombosis related to lymphadenopathy. 4. Acute findings discussed with and reconfirmed by Dr.DAVID YELVERTON on 11/29/2018 at 10:18 pm. Aortic Atherosclerosis (ICD10-I70.0). Electronically Signed   By: Elon Alas M.D.   On: 11/29/2018 22:18        Scheduled Meds: Continuous Infusions: . sodium chloride    . heparin 1,250 Units/hr (11/30/18 0107)     LOS: 0 days    Time spent: 25 minutes    Allanah Mcfarland,LAWAL, MD [facility] Pager [phone] If 7PM-7AM, please contact night-coverage [email or web address] Password [facility] 11/30/2018, 9:14 AM

## 2018-11-30 NOTE — Progress Notes (Signed)
Patient ID: Miguel Moreno, male   DOB: [birth year], 67 y.o.   MRN: [id number] Request received for neck mass biopsy in patient.  Imaging studies were reviewed by Dr. Pascal Lux.  Right cervical lymph node is amenable to biopsy ,however would recommend further staging CT of the chest ,abdomen and pelvis in meantime.  Biopsy will be performed early next week.

## 2018-11-30 NOTE — Progress Notes (Signed)
Patient arrived on the  via care link transportation at this time. Patient is alert and oriented x 4. Denies pain. Patient oriented to  room and equipment. Call bell is within reach. Will monitor.

## 2018-11-30 NOTE — Progress Notes (Signed)
ANTICOAGULATION CONSULT NOTE - Preliminary  Pharmacy Consult for Heparin Indication: DVT treatment  No Known Allergies  Patient Measurements: Height: 5\' 4"  (162.6 cm) Weight: 173 lb (78.5 kg) IBW/kg (Calculated) : 59.2 HEPARIN DW (KG): 75.3   Vital Signs: Temp: 98.1 F (36.7 C) (12/13 2231) Temp Source: Oral (12/13 2231) BP: 140/72 (12/13 2330) Pulse Rate: 82 (12/13 2330)  Labs: Recent Labs    11/29/18 2012  HGB 16.3  HCT 49.5  PLT 319  CREATININE 1.00   Estimated Creatinine Clearance: 66 mL/min (by C-G formula based on SCr of 1 mg/dL).  Medical History: Past Medical History:  Diagnosis Date  . Anxiety and depression   . Heart murmur     Medications:   Assessment: 67 yo male seen in the ED for left neck mass. CT shows internal jugular thrombosis. Pharmacy has been consulted for IV heparin dosing.  Goal of Therapy:  Heparin goal level: 0.3-0.7 unit/sml Monitor platelets by anticoagulation protocol: Yes   Plan:  Heparin 4000 unit IV bolus Heparin infusion at 1250 units/hr Heparin level in 6-8 hours  Preliminary review of pertinent patient information completed.  Forestine Na clinical pharmacist will complete review during morning rounds to assess the patient and finalize treatment regimen.  Norberto Sorenson, [facility] 11/30/2018,12:34 AM

## 2018-11-30 NOTE — Plan of Care (Signed)

## 2018-12-01 DIAGNOSIS — F419 Anxiety disorder, unspecified: Secondary | ICD-10-CM | POA: Diagnosis present

## 2018-12-01 DIAGNOSIS — R590 Localized enlarged lymph nodes: Secondary | ICD-10-CM

## 2018-12-01 DIAGNOSIS — F329 Major depressive disorder, single episode, unspecified: Secondary | ICD-10-CM | POA: Diagnosis present

## 2018-12-01 DIAGNOSIS — E876 Hypokalemia: Secondary | ICD-10-CM | POA: Diagnosis present

## 2018-12-01 DIAGNOSIS — C760 Malignant neoplasm of head, face and neck: Secondary | ICD-10-CM | POA: Diagnosis present

## 2018-12-01 DIAGNOSIS — F411 Generalized anxiety disorder: Secondary | ICD-10-CM | POA: Diagnosis present

## 2018-12-01 DIAGNOSIS — F17210 Nicotine dependence, cigarettes, uncomplicated: Secondary | ICD-10-CM

## 2018-12-01 DIAGNOSIS — Z791 Long term (current) use of non-steroidal anti-inflammatories (NSAID): Secondary | ICD-10-CM | POA: Diagnosis not present

## 2018-12-01 DIAGNOSIS — R011 Cardiac murmur, unspecified: Secondary | ICD-10-CM | POA: Diagnosis present

## 2018-12-01 DIAGNOSIS — I82C11 Acute embolism and thrombosis of right internal jugular vein: Secondary | ICD-10-CM | POA: Diagnosis present

## 2018-12-01 DIAGNOSIS — Z716 Tobacco abuse counseling: Secondary | ICD-10-CM | POA: Diagnosis not present

## 2018-12-01 DIAGNOSIS — Z7982 Long term (current) use of aspirin: Secondary | ICD-10-CM | POA: Diagnosis not present

## 2018-12-01 DIAGNOSIS — Z720 Tobacco use: Secondary | ICD-10-CM | POA: Diagnosis not present

## 2018-12-01 DIAGNOSIS — R221 Localized swelling, mass and lump, neck: Secondary | ICD-10-CM | POA: Diagnosis not present

## 2018-12-01 LAB — CBC
HCT: 45.5 % (ref 39.0–52.0)
Hemoglobin: 14.8 g/dL (ref 13.0–17.0)
MCH: 29.6 pg (ref 26.0–34.0)
MCHC: 32.5 g/dL (ref 30.0–36.0)
MCV: 91 fL (ref 80.0–100.0)
Platelets: 284 10*3/uL (ref 150–400)
RBC: 5 MIL/uL (ref 4.22–5.81)
RDW: 13.6 % (ref 11.5–15.5)
WBC: 10.3 10*3/uL (ref 4.0–10.5)
nRBC: 0 % (ref 0.0–0.2)

## 2018-12-01 LAB — HEPARIN LEVEL (UNFRACTIONATED): Heparin Unfractionated: 0.55 IU/mL (ref 0.30–0.70)

## 2018-12-01 NOTE — Plan of Care (Signed)

## 2018-12-01 NOTE — Progress Notes (Signed)
Patient ID: Miguel Moreno, male   DOB: June 22, 1951, 67 y.o.   MRN: [id number]   PROGRESS NOTE    Miguel Moreno  ACZ:[id number] DOB: 24-May-1951 DOA: 11/29/2018 PCP: Patient, No Pcp Per   Brief Narrative:   Miguel Moreno  is a 67 y.o. male, with history of anxiety and depression, heart murmur came to the hospital with complaints of right-sided neck mass.  Patient says that he has experienced pain during swallowing.  Denies shortness of breath.  No fever or chills.  No nausea vomiting or diarrhea. In the ED CT scan of the neck showed cystic/necrotic right more than left neck lymphadenopathy consistent with metastatic disease.  17 mm right palatine tonsil submucosal mass, highly suspicious for primary head and neck cancer. Oncology, Dr. Marin Olp was consulted by ED physician He recommended patient was started on IV heparin and transferred to [facility] for biopsy of neck mass.   Assessment & Plan:   Principal Problem:   Neck mass Active Problems:   Tobacco abuse   Anxiety state   #1 neck mass: No new issues today.  Suspected malignancy.  Patient is to have fine-needle biopsy done on Monday.  Patient currently on heparin which she will be tolerant of 6 hours prior to procedure. Continue supportive care.  #2 right internal jugular vein thrombosis: Patient currently on heparin drip.  We will keep in heparin drip until after biopsy.  Patient may then be transitioned to oral anticoagulants  #3 tobacco abuse: Counseling provided continue nicotine patch.  #4 anxiety state: Continue home regimen.   DVT prophylaxis: Heparin Code Status: Full code Family Communication: No family available Disposition Plan: Home when ready  Consultants:   Dr. Marin Olp of oncology  Procedures:   Fine-needle aspiration biopsy to be done  Antimicrobials:   None   Subjective: Patient has no new complaints today.  He is resting comfortably  Objective: Vitals:   11/30/18 0431 11/30/18 1332 11/30/18 1957  12/01/18 0504  BP: 117/72 129/88 129/75 118/76  Pulse: 74 95 75 72  Resp: 19 18 16 18   Temp: 98 F (36.7 C) 98.1 F (36.7 C) 98.2 F (36.8 C) (!) 97.4 F (36.3 C)  TempSrc: Oral Oral Oral Oral  SpO2: 97% 93% 93% 98%  Weight:      Height:       No intake or output data in the 24 hours ending 12/01/18 1033 Filed Weights   11/29/18 1930 11/30/18 0221  Weight: 78.5 kg 75.2 kg    Examination:  General exam: Appears calm and comfortable  Respiratory system: Clear to auscultation. Respiratory effort normal. Cardiovascular system: S1 & S2 heard, RRR. No JVD, murmurs, rubs, gallops or clicks. No pedal edema. Gastrointestinal system: Abdomen is nondistended, soft and nontender. No organomegaly or masses felt. Normal bowel sounds heard. Central nervous system: Alert and oriented. No focal neurological deficits. Extremities: Symmetric 5 x 5 power. Skin: No rashes, lesions or ulcers Psychiatry: Judgement and insight appear normal. Mood & affect appropriate.     Data Reviewed: I have personally reviewed following labs and imaging studies  CBC: Recent Labs  Lab 11/29/18 2012 11/30/18 0728 12/01/18 0448  WBC 11.8* 11.7* 10.3  NEUTROABS 7.7  --   --   HGB 16.3 14.8 14.8  HCT 49.5 44.0 45.5  MCV 89.5 88.2 91.0  PLT 319 272 109   Basic Metabolic Panel: Recent Labs  Lab 11/29/18 2012 11/30/18 0728  NA 137 138  K 3.4* 4.2  CL 101 104  CO2 25  23  GLUCOSE 96 95  BUN 13 13  CREATININE 1.00 0.96  CALCIUM 9.3 8.7*   GFR: Estimated Creatinine Clearance: 67.4 mL/min (by C-G formula based on SCr of 0.96 mg/dL). Liver Function Tests: Recent Labs  Lab 11/30/18 0728  AST 14*  ALT 10  ALKPHOS 56  BILITOT 1.0  PROT 6.6  ALBUMIN 3.7   No results for input(s): LIPASE, AMYLASE in the last 168 hours. No results for input(s): AMMONIA in the last 168 hours. Coagulation Profile: Recent Labs  Lab 11/29/18 0010  INR 1.00   Cardiac Enzymes: No results for input(s): CKTOTAL,  CKMB, CKMBINDEX, TROPONINI in the last 168 hours. BNP (last 3 results) No results for input(s): PROBNP in the last 8760 hours. HbA1C: No results for input(s): HGBA1C in the last 72 hours. CBG: No results for input(s): GLUCAP in the last 168 hours. Lipid Profile: No results for input(s): CHOL, HDL, LDLCALC, TRIG, CHOLHDL, LDLDIRECT in the last 72 hours. Thyroid Function Tests: No results for input(s): TSH, T4TOTAL, FREET4, T3FREE, THYROIDAB in the last 72 hours. Anemia Panel: No results for input(s): VITAMINB12, FOLATE, FERRITIN, TIBC, IRON, RETICCTPCT in the last 72 hours. Sepsis Labs: No results for input(s): PROCALCITON, LATICACIDVEN in the last 168 hours.  No results found for this or any previous visit (from the past 240 hour(s)).       Radiology Studies: Ct Soft Tissue Neck W Contrast  Result Date: 11/29/2018 CLINICAL DATA:  RIGHT ear pain and RIGHT lymphadenopathy after cleaning ear with peroxide 3 weeks ago. EXAM: CT NECK WITH CONTRAST TECHNIQUE: Multidetector CT imaging of the neck was performed using the standard protocol following the bolus administration of intravenous contrast. CONTRAST:  70mL ISOVUE-300 IOPAMIDOL (ISOVUE-300) INJECTION 61% COMPARISON:  None. FINDINGS: PHARYNX AND LARYNX: Isodense 17 mm nodule suspected RIGHT palatine tonsil (axial image 38, coronal image 49). Widely patent airway. SALIVARY GLANDS: Normal. THYROID: Subcentimeter calcification RIGHT thyroid lobe, thyroid is otherwise unremarkable. LYMPH NODES: RIGHT level 2 necrotic/cystic nodal conglomeration measuring to 3.4 x 3.9 cm, Dominant lymph node measuring 2.2 x 3.1 cm. LEFT level 2 B cystic 11 mm and LEFT level 3 12 mm lymph nodes. Additional smaller LEFT neck lymph nodes. VASCULAR: RIGHT internal jugular vein distension with central filling defect at level of mid neck associated with lymphadenopathy. Mild calcific atherosclerosis carotid bifurcations. LIMITED INTRACRANIAL: Normal. VISUALIZED ORBITS:  LEFT ocular lens implant. MASTOIDS AND VISUALIZED PARANASAL SINUSES: Mild paranasal sinus mucosal thickening without air-fluid levels. SKELETON: Nonacute. Patient is edentulous. Mild RIGHT temporomandibular osteoarthrosis. Severe lower cervical spondylosis. Moderate cervical facet arthropathy. UPPER CHEST: Lung apices are clear. Centrilobular emphysema. No superior mediastinal lymphadenopathy. OTHER: None. IMPRESSION: 1. Cystic/necrotic RIGHT > LEFT neck lymphadenopathy consistent with metastatic disease. 2. 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 Recommend ENT consultation. 3. Acute RIGHT internal jugular vein thrombosis related to lymphadenopathy. 4. Acute findings discussed with and reconfirmed by Dr.DAVID YELVERTON on 11/29/2018 at 10:18 pm. Aortic Atherosclerosis (ICD10-I70.0). Electronically Signed   By: Elon Alas M.D.   On: 11/29/2018 22:18        Scheduled Meds: . feeding supplement (ENSURE ENLIVE)  237 mL Oral Q24H  . multivitamin  15 mL Oral Daily   Continuous Infusions: . sodium chloride    . heparin 1,250 Units/hr (11/30/18 1631)     LOS: 0 days    Time spent: 25 minutes    Baylin Gamblin,LAWAL, MD [facility] Pager [phone] If 7PM-7AM, please contact night-coverage [email or web address] Password  [email or web address] 12/01/2018, 10:33 AM

## 2018-12-01 NOTE — Consult Note (Addendum)
Referral MD  Reason for Referral: Cervical lymphadenopathy  Chief Complaint  Patient presents with  . Otalgia    right lymphnode swollen  : I have a lump on my right neck.  HPI: Mr. Mollenkopf is a very nice 67 year old white male.  He is originally from [place].  He has been out in [place] for about 30 years.  He is a smoker.  He probably has at least an 80-pack-year history of tobacco use.  He currently smokes 1 pack/day.  He noticed a lump on the right side of his neck.  This is probably a few weeks ago.  He is hoarse.  But he says he has always had little bit of a scratchy voice.  He has not lost any obvious weight.  He says that he "power walks" and has maintained his weight that way.  He went to the emergency room up in [place] where he lives.  He had a CT scan done.  This showed no chronic adenopathy in the neck.  Right lymph nodes were greater than left cervical lymph nodes.  There was a right palatine tonsil lesion that was measured 17 mm.  He subsequently was found to have a thrombus in the right internal jugular vein.  He was started on heparin.  He was transferred to [facility].  He drinks alcohol on occasion.  There is a history of cancer in the family.  He is a great his grandparents had cancer.  There is been no nausea or vomiting.  He has had no obvious change in bowel or bladder habits.  There is no family doctor that he sees up in [place].  Overall, his performance status is ECOG 1.   Past Medical History:  Diagnosis Date  . Anxiety and depression   . Heart murmur   :  Past Surgical History:  Procedure Laterality Date  . CATARACT EXTRACTION    :   Current Facility-Administered Medications:  .  0.9 %  sodium chloride infusion, , Intravenous, Continuous, Darrick Meigs, Marge Duncans, MD .  acetaminophen (TYLENOL) tablet 650 mg, 650 mg, Oral, Q6H PRN, 650 mg at 12/01/18 0839 **OR** acetaminophen (TYLENOL) suppository 650 mg, 650 mg, Rectal, Q6H PRN, Darrick Meigs, Marge Duncans, MD .  feeding supplement (ENSURE ENLIVE) (ENSURE ENLIVE) liquid 237 mL, 237 mL, Oral, Q24H, Garba, Mohammad L, MD, 237 mL at 11/30/18 2034 .  heparin ADULT infusion 100 units/mL (25000 units/263mL sodium chloride 0.45%), 1,250 Units/hr, Intravenous, Continuous, Darrick Meigs, Marge Duncans, MD, Last Rate: 12.5 mL/hr at 11/30/18 1631, 1,250 Units/hr at 11/30/18 1631 .  multivitamin liquid 15 mL, 15 mL, Oral, Daily, Gala Romney L, MD, 15 mL at 12/01/18 0839 .  ondansetron (ZOFRAN) tablet 4 mg, 4 mg, Oral, Q6H PRN **OR** ondansetron (ZOFRAN) injection 4 mg, 4 mg, Intravenous, Q6H PRN, Darrick Meigs, Marge Duncans, MD:  . feeding supplement (ENSURE ENLIVE)  237 mL Oral Q24H  . multivitamin  15 mL Oral Daily  :  No Known Allergies:  No family history on file.:  Social History   Socioeconomic History  . Marital status: Widowed    Spouse name: Not on file  . Number of children: Not on file  . Years of education: Not on file  . Highest education level: Not on file  Occupational History  . Not on file  Social Needs  . Financial resource strain: Not on file  . Food insecurity:    Worry: Not on file    Inability: Not on file  . Transportation  needs:    Medical: Not on file    Non-medical: Not on file  Tobacco Use  . Smoking status: Current Every Day Smoker    Packs/day: 1.00    Years: 20.00    Pack years: 20.00    Types: Cigarettes  . Smokeless tobacco: Never Used  Substance and Sexual Activity  . Alcohol use: Never    Frequency: Never  . Drug use: Never  . Sexual activity: Not on file  Lifestyle  . Physical activity:    Days per week: Not on file    Minutes per session: Not on file  . Stress: Not on file  Relationships  . Social connections:    Talks on phone: Not on file    Gets together: Not on file    Attends religious service: Not on file    Active member of club or organization: Not on file    Attends meetings of clubs or organizations: Not on file    Relationship status: Not on file  .  Intimate partner violence:    Fear of current or ex partner: Not on file    Emotionally abused: Not on file    Physically abused: Not on file    Forced sexual activity: Not on file  Other Topics Concern  . Not on file  Social History Narrative  . Not on file  :  Review of Systems  Constitutional: Negative.   HENT: Negative.   Eyes: Negative.   Respiratory: Negative.   Cardiovascular: Negative.   Gastrointestinal: Negative.   Genitourinary: Negative.   Musculoskeletal: Negative.   Skin: Negative.   Neurological: Negative.   Endo/Heme/Allergies: Negative.   Psychiatric/Behavioral: Negative.      Exam: Well-developed well-nourished white male in no obvious distress.  Head neck exam does show the adenopathy on the right side of the neck that is palpable.  This is most prominent at the angle of the jaw.  There might be some slight fullness on the left side of the neck.  Thyroid is nonpalpable.  Lungs are clear.  Cardiac exam regular rate and rhythm.  Abdomen is soft.  He is obese.  There is no fluid wave.  There is no palpable liver or spleen tip.  Back exam shows no tenderness over the spine, ribs or hips.  Extremities shows no clubbing, cyanosis or edema.  Skin exam shows no rashes, ecchymoses or petechia.  Neurological exam is nonfocal. Patient Vitals for the past 24 hrs:  BP Temp Temp src Pulse Resp SpO2  12/01/18 1223 119/69 97.7 F (36.5 C) Oral 67 11 97 %  12/01/18 0504 118/76 (!) 97.4 F (36.3 C) Oral 72 18 98 %  11/30/18 1957 129/75 98.2 F (36.8 C) Oral 75 16 93 %     Recent Labs    11/30/18 0728 12/01/18 0448  WBC 11.7* 10.3  HGB 14.8 14.8  HCT 44.0 45.5  PLT 272 284   Recent Labs    11/29/18 2012 11/30/18 0728  NA 137 138  K 3.4* 4.2  CL 101 104  CO2 25 23  GLUCOSE 96 95  BUN 13 13  CREATININE 1.00 0.96  CALCIUM 9.3 8.7*    Blood smear review: None  Pathology: Pending    Assessment and Plan: Mr. Wotton is a 67 year old white male.  He has  cervical adenopathy.  Looks like this is bilateral adenopathy.  He has a palatine tonsil lesion on the right.  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  I would definitely make sure that enough material is sent for biopsy so that we can check for HPV involvement.  This definitely is prognostic.  If he has bilateral disease in the neck, that he will have at least stage III disease.  Since he lives up in [place], he can be treated at the cancer center in [place].  His radiation, which likely will be needed, can be done up with the [facility] facility in [place].  He is a really nice guy.  It was fun talking to him.  He has a good performance status so aggressive intervention could be undertaken to try to cure this.  I agree with doing CT scan of the chest/abdomen/pelvis to see if there is any evidence of metastatic disease.  We will follow along and try to help in any way possible.  Unfortunately we cannot do a PET scan on him with him as an inpatient.  This would be very helpful with respect to treatment planning.  As far as it goes for the right internal jugular vein thrombus, I would keep him on heparin until any invasive procedure is completed.  Then switch him over to 1 of the oral anticoagulants.  I think Xarelto would be reasonable since this is daily.  Lattie Haw, MD  Jeneen Rinks 1:5-7

## 2018-12-01 NOTE — Progress Notes (Signed)
ANTICOAGULATION CONSULT NOTE  Pharmacy Consult for Heparin Indication: DVT treatment  No Known Allergies  Patient Measurements: Height: 5\' 4"  (162.6 cm) Weight: 165 lb 12.6 oz (75.2 kg) IBW/kg (Calculated) : 59.2 HEPARIN DW (KG): 75.3   Vital Signs: Temp: 97.4 F (36.3 C) (12/15 0504) Temp Source: Oral (12/15 0504) BP: 118/76 (12/15 0504) Pulse Rate: 72 (12/15 0504)  Labs: Recent Labs    11/29/18 0010  11/29/18 2012 11/30/18 0728 11/30/18 1237 12/01/18 0448  HGB  --    < > 16.3 14.8  --  14.8  HCT  --   --  49.5 44.0  --  45.5  PLT  --   --  319 272  --  284  APTT 39*  --   --   --   --   --   LABPROT 13.1  --   --   --   --   --   INR 1.00  --   --   --   --   --   HEPARINUNFRC  --   --   --  0.65 0.54 0.55  CREATININE  --   --  1.00 0.96  --   --    < > = values in this interval not displayed.   Estimated Creatinine Clearance: 67.4 mL/min (by C-G formula based on SCr of 0.96 mg/dL).  Medical History: Past Medical History:  Diagnosis Date  . Anxiety and depression   . Heart murmur     Medications:   Assessment: 67 yo male seen in the ED for left neck mass. CT shows internal jugular thrombosis. Pharmacy has been consulted for IV heparin dosing.  Heparin level therapeutic at 0.55 units/ml this am. CBC WNL.   Goal of Therapy:  Heparin goal level: 0.3-0.7 unit/ml Monitor platelets by anticoagulation protocol: Yes   Plan:  Continue heparin infusion at 1250 units/hr Daily heparin level, CBC while on heparin.  Follow up plans for oral anticoagulation after biopsy  Harrietta Guardian, PharmD PGY1 Pharmacy Resident 12/01/2018    8:45 AM Please check AMION for all [place] numbers

## 2018-12-01 NOTE — Consult Note (Signed)
Chief Complaint: Patient was seen in consultation today for image guided right cervical lymph node/neck mass biopsy  Chief Complaint  Patient presents with  . Otalgia    right lymphnode swollen     Referring Physician(s): Lama,G/Ennever,P  Supervising Physician: Marybelle Killings  Patient Status: [facility] - In-pt  History of Present Illness: Miguel Moreno is a 67 y.o. male smoker with about 2-3 week hx rt neck mass/ear pain and subsequent imaging studies which revealed cystic/necrotic R>L neck LAN with rt palantine tonsillar mass and acute rt IJ thrombosis . No prior hx cancer. He is currently on IV heparin. Request received now for rt neck mass/nodal biopsy.  Past Medical History:  Diagnosis Date  . Anxiety and depression   . Heart murmur     Past Surgical History:  Procedure Laterality Date  . CATARACT EXTRACTION      Allergies: Patient has no known allergies.  Medications: Prior to Admission medications   Medication Sig Start Date End Date Taking? Authorizing Provider  aspirin EC 81 MG tablet Take 81 mg by mouth daily.   Yes [provider]  naproxen sodium (ALEVE) 220 MG tablet Take 220 mg by mouth.   Yes [provider]     No family history on file.  Social History   Socioeconomic History  . Marital status: Widowed    Spouse name: Not on file  . Number of children: Not on file  . Years of education: Not on file  . Highest education level: Not on file  Occupational History  . Not on file  Social Needs  . Financial resource strain: Not on file  . Food insecurity:    Worry: Not on file    Inability: Not on file  . Transportation needs:    Medical: Not on file    Non-medical: Not on file  Tobacco Use  . Smoking status: Current Every Day Smoker    Packs/day: 1.00    Years: 20.00    Pack years: 20.00    Types: Cigarettes  . Smokeless tobacco: Never Used  Substance and Sexual Activity  . Alcohol use: Never    Frequency: Never  . Drug  use: Never  . Sexual activity: Not on file  Lifestyle  . Physical activity:    Days per week: Not on file    Minutes per session: Not on file  . Stress: Not on file  Relationships  . Social connections:    Talks on phone: Not on file    Gets together: Not on file    Attends religious service: Not on file    Active member of club or organization: Not on file    Attends meetings of clubs or organizations: Not on file    Relationship status: Not on file  Other Topics Concern  . Not on file  Social History Narrative  . Not on file     Review of Systems denies fever,HA, CP ,worsening dyspnea, abd pain,N/V, dysphagia or bleeding. He dose have occ cough, sinus drainage and back pain.   Vital Signs: BP 119/69 (BP Location: Left Arm)   Pulse 67   Temp 97.7 F (36.5 C) (Oral)   Resp 11   Ht 5\' 4"  (1.626 m)   Wt 165 lb 12.6 oz (75.2 kg)   SpO2 97%   BMI 28.46 kg/m   Physical Exam awake/alert; chest- distant BS bilat; heart- RRR; abd- soft,+BS,NT; no LE edema; R>L cervical LAN, mildly tender to palpation  Imaging: Ct Soft  Tissue Neck W Contrast  Result Date: 11/29/2018 CLINICAL DATA:  RIGHT ear pain and RIGHT lymphadenopathy after cleaning ear with peroxide 3 weeks ago. EXAM: CT NECK WITH CONTRAST TECHNIQUE: Multidetector CT imaging of the neck was performed using the standard protocol following the bolus administration of intravenous contrast. CONTRAST:  31mL ISOVUE-300 IOPAMIDOL (ISOVUE-300) INJECTION 61% COMPARISON:  None. FINDINGS: PHARYNX AND LARYNX: Isodense 17 mm nodule suspected RIGHT palatine tonsil (axial image 38, coronal image 49). Widely patent airway. SALIVARY GLANDS: Normal. THYROID: Subcentimeter calcification RIGHT thyroid lobe, thyroid is otherwise unremarkable. LYMPH NODES: RIGHT level 2 necrotic/cystic nodal conglomeration measuring to 3.4 x 3.9 cm, Dominant lymph node measuring 2.2 x 3.1 cm. LEFT level 2 B cystic 11 mm and LEFT level 3 12 mm lymph nodes. Additional  smaller LEFT neck lymph nodes. VASCULAR: RIGHT internal jugular vein distension with central filling defect at level of mid neck associated with lymphadenopathy. Mild calcific atherosclerosis carotid bifurcations. LIMITED INTRACRANIAL: Normal. VISUALIZED ORBITS: LEFT ocular lens implant. MASTOIDS AND VISUALIZED PARANASAL SINUSES: Mild paranasal sinus mucosal thickening without air-fluid levels. SKELETON: Nonacute. Patient is edentulous. Mild RIGHT temporomandibular osteoarthrosis. Severe lower cervical spondylosis. Moderate cervical facet arthropathy. UPPER CHEST: Lung apices are clear. Centrilobular emphysema. No superior mediastinal lymphadenopathy. OTHER: None. IMPRESSION: 1. Cystic/necrotic RIGHT > LEFT neck lymphadenopathy consistent with metastatic disease. 2. 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 Recommend ENT consultation. 3. Acute RIGHT internal jugular vein thrombosis related to lymphadenopathy. 4. Acute findings discussed with and reconfirmed by Dr.DAVID YELVERTON on 11/29/2018 at 10:18 pm. Aortic Atherosclerosis (ICD10-I70.0). Electronically Signed   By: Elon Alas M.D.   On: 11/29/2018 22:18    Labs:  CBC: Recent Labs    11/29/18 2012 11/30/18 0728 12/01/18 0448  WBC 11.8* 11.7* 10.3  HGB 16.3 14.8 14.8  HCT 49.5 44.0 45.5  PLT 319 272 284    COAGS: Recent Labs    11/29/18 0010  INR 1.00  APTT 39*    BMP: Recent Labs    11/29/18 2012 11/30/18 0728  NA 137 138  K 3.4* 4.2  CL 101 104  CO2 25 23  GLUCOSE 96 95  BUN 13 13  CALCIUM 9.3 8.7*  CREATININE 1.00 0.96  GFRNONAA >60 >60  GFRAA >60 >60    LIVER FUNCTION TESTS: Recent Labs    11/30/18 0728  BILITOT 1.0  AST 14*  ALT 10  ALKPHOS 56  PROT 6.6  ALBUMIN 3.7    TUMOR MARKERS: No results for input(s): AFPTM, CEA, CA199, CHROMGRNA in the last 8760 hours.  Assessment and Plan: 67 y.o. male smoker with about 2-3 week hx rt neck  mass/ear pain and subsequent imaging studies which revealed cystic/necrotic R>L neck LAN with rt palantine tonsillar mass and acute rt IJ thrombosis . He is currently on IV heparin. Request received now for rt neck mass/nodal biopsy. Imaging studies were reviewed by Dr. Pascal Lux. Rt cervical lymph node is amenable to bx. Risks and benefits discussed with the patient including, but not limited to bleeding, infection, damage to adjacent structures or low yield requiring additional tests.  All of the patient's questions were answered, patient is agreeable to proceed. Consent signed and in chart.  Procedure scheduled for 12/16; IV heparin will need to be stopped prior to procedure- will determine timing on 12/16    Thank you for this interesting consult.  I greatly enjoyed meeting Miguel Moreno and look forward to participating in their care.  A  copy of this report was sent to the requesting provider on this date.  Electronically Signed: D. Rowe Robert, PA-C 12/01/2018, 2:30 PM   I spent a total of  25 minutes   in face to face in clinical consultation, greater than 50% of which was counseling/coordinating care for image guided right cervical lymph node/neck mass biopsy

## 2018-12-02 ENCOUNTER — Inpatient Hospital Stay (HOSPITAL_COMMUNITY): Payer: Medicare HMO

## 2018-12-02 DIAGNOSIS — R221 Localized swelling, mass and lump, neck: Secondary | ICD-10-CM

## 2018-12-02 DIAGNOSIS — Z720 Tobacco use: Secondary | ICD-10-CM

## 2018-12-02 DIAGNOSIS — E876 Hypokalemia: Secondary | ICD-10-CM

## 2018-12-02 LAB — CBC
HCT: 43.9 % (ref 39.0–52.0)
Hemoglobin: 14.4 g/dL (ref 13.0–17.0)
MCH: 29 pg (ref 26.0–34.0)
MCHC: 32.8 g/dL (ref 30.0–36.0)
MCV: 88.3 fL (ref 80.0–100.0)
Platelets: 287 10*3/uL (ref 150–400)
RBC: 4.97 MIL/uL (ref 4.22–5.81)
RDW: 13.4 % (ref 11.5–15.5)
WBC: 10.9 10*3/uL — ABNORMAL HIGH (ref 4.0–10.5)
nRBC: 0 % (ref 0.0–0.2)

## 2018-12-02 LAB — PROTIME-INR
INR: 0.96
Prothrombin Time: 12.7 seconds (ref 11.4–15.2)

## 2018-12-02 LAB — APTT: aPTT: 112 seconds — ABNORMAL HIGH (ref 24–36)

## 2018-12-02 LAB — HEPARIN LEVEL (UNFRACTIONATED): Heparin Unfractionated: 0.54 IU/mL (ref 0.30–0.70)

## 2018-12-02 MED ORDER — FENTANYL CITRATE (PF) 100 MCG/2ML IJ SOLN
INTRAMUSCULAR | Status: AC | PRN
  Administered 2018-12-02: 25 ug via INTRAVENOUS
  Administered 2018-12-02: 50 ug via INTRAVENOUS

## 2018-12-02 MED ORDER — FENTANYL CITRATE (PF) 100 MCG/2ML IJ SOLN
INTRAMUSCULAR | Status: AC
  Filled 2018-12-02: qty 2

## 2018-12-02 MED ORDER — LIDOCAINE HCL (PF) 1 % IJ SOLN
INTRAMUSCULAR | Status: AC
  Filled 2018-12-02: qty 30

## 2018-12-02 MED ORDER — MIDAZOLAM HCL 2 MG/2ML IJ SOLN
INTRAMUSCULAR | Status: AC | PRN
  Administered 2018-12-02 (×2): 1 mg via INTRAVENOUS

## 2018-12-02 MED ORDER — MIDAZOLAM HCL 2 MG/2ML IJ SOLN
INTRAMUSCULAR | Status: AC
  Filled 2018-12-02: qty 2

## 2018-12-02 NOTE — Progress Notes (Signed)
PROGRESS NOTE    Izel Eisenhardt  AQT:[id number] DOB: 1951/11/16 DOA: 11/29/2018 PCP: Patient, No Pcp Per   Brief Narrative:  HPI on 11/30/2018 by Dr. Eleonore Chiquito Lorenza Winkleman  is a 67 y.o. male, with history of anxiety and depression, heart murmur came to the hospital with complaints of right-sided neck mass.  Patient says that he has experienced pain during swallowing.  Denies shortness of breath.  No fever or chills.  No nausea vomiting or diarrhea. In the ED CT scan of the neck showed cystic/necrotic right more than left neck lymphadenopathy consistent with metastatic disease.  17 mm right palatine tonsil submucosal mass, highly suspicious for primary head and neck cancer. Oncology, Dr. Marin Olp was consulted by ED physician He recommended patient was started on IV heparin and transferred to [facility] for biopsy of neck mass. Assessment & Plan   Neck mass -suspected malignancy -Noted on CT soft tissue neck: Thick/necrotic right> left neck lymphadenopathy consistent with metastatic disease.  17 mm right palatine tonsillar submucosal mass suspicious for primary head and neck cancer.  Recommended ENT consultation.  -Hematology/ oncology consulted and appreciated, recommended CT scan of chest abdomen pelvis for evidence of metastatic disease.  -Interventional radiology consulted and appreciated, status post ultrasound core biopsy -Oncology recommending to send biopsy for HPV testing  Right internal jugular vein thrombosis -CT neck: Acute right internal jugular vein thrombosis related to lymphadenopathy. -Placed on heparin, per hematology oncology, patient may be transitioned to Xarelto  Tobacco abuse -discussed smoking cessation -continue nicotine  Hypokalemia  -Resolved with replacement  DVT Prophylaxis  heparin  Code Status: Full  Family Communication: None at bedside  Disposition Plan: Admitted. Will obtain CT chest/abd/pelvis- pending further recommendations from  Hem/onc  Consultants Hematology/oncology  Procedures  [place] core biopsy   Antibiotics   Anti-infectives (From admission, onward)   None      Subjective:   Kinnick Maus seen and examined today.  Anxious anxiously waiting for biopsy today.  Denies current chest pain, shortness of breath, abdominal pain, nausea or vomiting, diarrhea constipation, dizziness or headache.  Feels very hungry and thirsty.  Objective:   Vitals:   12/02/18 1342 12/02/18 1402 12/02/18 1408 12/02/18 1410  BP: 129/83 131/83 124/69 112/78  Pulse: 80 80 75 75  Resp: 20 (!) 26 (!) 23 20  Temp:      TempSrc:      SpO2: 98% 98% 96% 94%  Weight:      Height:        Intake/Output Summary (Last 24 hours) at 12/02/2018 1524 Last data filed at 12/02/2018 0900 Gross per 24 hour  Intake 240 ml  Output -  Net 240 ml   Filed Weights   11/29/18 1930 11/30/18 0221  Weight: 78.5 kg 75.2 kg    Exam  General: Well developed, well nourished, NAD, appears stated age  HEENT: NCAT, mucous membranes moist.   Neck: Supple, right-sided lymphadenopathy.  Left sided fullness.  Cardiovascular: S1 S2 auscultated, no rubs, murmurs or gallops. Regular rate and rhythm.  Respiratory: Clear to auscultation bilaterally with equal chest rise  Abdomen: Soft, nontender, nondistended, + bowel sounds  Extremities: warm dry without cyanosis clubbing or edema  Neuro: AAOx3, nonfocal  Psych: Anxious however appropriate   Data Reviewed: I have personally reviewed following labs and imaging studies  CBC: Recent Labs  Lab 11/29/18 2012 11/30/18 0728 12/01/18 0448 12/02/18 0345  WBC 11.8* 11.7* 10.3 10.9*  NEUTROABS 7.7  --   --   --  HGB 16.3 14.8 14.8 14.4  HCT 49.5 44.0 45.5 43.9  MCV 89.5 88.2 91.0 88.3  PLT 319 272 284 161   Basic Metabolic Panel: Recent Labs  Lab 11/29/18 2012 11/30/18 0728  NA 137 138  K 3.4* 4.2  CL 101 104  CO2 25 23  GLUCOSE 96 95  BUN 13 13  CREATININE 1.00 0.96  CALCIUM 9.3  8.7*   GFR: Estimated Creatinine Clearance: 67.4 mL/min (by C-G formula based on SCr of 0.96 mg/dL). Liver Function Tests: Recent Labs  Lab 11/30/18 0728  AST 14*  ALT 10  ALKPHOS 56  BILITOT 1.0  PROT 6.6  ALBUMIN 3.7   No results for input(s): LIPASE, AMYLASE in the last 168 hours. No results for input(s): AMMONIA in the last 168 hours. Coagulation Profile: Recent Labs  Lab 11/29/18 0010 12/02/18 0345  INR 1.00 0.96   Cardiac Enzymes: No results for input(s): CKTOTAL, CKMB, CKMBINDEX, TROPONINI in the last 168 hours. BNP (last 3 results) No results for input(s): PROBNP in the last 8760 hours. HbA1C: No results for input(s): HGBA1C in the last 72 hours. CBG: No results for input(s): GLUCAP in the last 168 hours. Lipid Profile: No results for input(s): CHOL, HDL, LDLCALC, TRIG, CHOLHDL, LDLDIRECT in the last 72 hours. Thyroid Function Tests: No results for input(s): TSH, T4TOTAL, FREET4, T3FREE, THYROIDAB in the last 72 hours. Anemia Panel: No results for input(s): VITAMINB12, FOLATE, FERRITIN, TIBC, IRON, RETICCTPCT in the last 72 hours. Urine analysis: No results found for: COLORURINE, APPEARANCEUR, LABSPEC, PHURINE, GLUCOSEU, HGBUR, BILIRUBINUR, KETONESUR, PROTEINUR, UROBILINOGEN, NITRITE, LEUKOCYTESUR Sepsis Labs: @LABRCNTIP (procalcitonin:4,lacticidven:4)  )No results found for this or any previous visit (from the past 240 hour(s)).    Radiology Studies: No results found.   Scheduled Meds: . feeding supplement (ENSURE ENLIVE)  237 mL Oral Q24H  . fentaNYL      . lidocaine (PF)      . midazolam      . multivitamin  15 mL Oral Daily   Continuous Infusions: . sodium chloride    . heparin 1,250 Units/hr (12/01/18 1503)     LOS: 1 day   Time Spent in minutes   30 minutes  Arie Gable D.O. on 12/02/2018 at 3:24 PM  Between 7am to 7pm - Please see pager noted on [email or web address]  After 7pm go to [email or web address]  And look for the night coverage person  covering for me after hours  [facility] Office  [phone]

## 2018-12-02 NOTE — Plan of Care (Signed)

## 2018-12-02 NOTE — Progress Notes (Signed)
Heparin drip stopped.  

## 2018-12-02 NOTE — Procedures (Signed)
R neck LN core Bx 18 g times three  EBL 0 Comp 0

## 2018-12-02 NOTE — Progress Notes (Addendum)
ANTICOAGULATION CONSULT NOTE  Pharmacy Consult:  Heparin Indication: DVT treatment  No Known Allergies  Patient Measurements: Height: 5\' 4"  (162.6 cm) Weight: 165 lb 12.6 oz (75.2 kg) IBW/kg (Calculated) : 59.2 HEPARIN DW (KG): 75.3  Vital Signs: Temp: 98.2 F (36.8 C) (12/16 0433) Temp Source: Oral (12/16 0433) BP: 128/70 (12/16 0433) Pulse Rate: 72 (12/16 0433)  Labs: Recent Labs    11/29/18 2012  11/30/18 0728 11/30/18 1237 12/01/18 0448 12/02/18 0345  HGB 16.3  --  14.8  --  14.8 14.4  HCT 49.5  --  44.0  --  45.5 43.9  PLT 319  --  272  --  284 287  APTT  --   --   --   --   --  112*  LABPROT  --   --   --   --   --  12.7  INR  --   --   --   --   --  0.96  HEPARINUNFRC  --    < > 0.65 0.54 0.55 0.54  CREATININE 1.00  --  0.96  --   --   --    < > = values in this interval not displayed.   Estimated Creatinine Clearance: 67.4 mL/min (by C-G formula based on SCr of 0.96 mg/dL).   Assessment: 35 YOM seen in the ED for left neck mass. CT shows internal jugular thrombosis and Pharmacy has been consulted for IV heparin dosing.  Heparin level therapeutic and stable.  No bleeding reported.  Goal of Therapy:  Heparin goal level: 0.3-0.7 unit/ml Monitor platelets by anticoagulation protocol: Yes   Plan:  Continue heparin gtt at 1250 units/hr >> on hold for biopsy Daily heparin level and CBC F/U plans for oral [facility] after biopsy Monday   Miguel Moreno D. Mina Marble, PharmD, BCPS, [place] 12/02/2018, 1:11 PM

## 2018-12-02 NOTE — Progress Notes (Signed)
Paged Hospitalist on call in order to see when heparin drip will be paused for planned biopsy today. Awaiting response.

## 2018-12-03 ENCOUNTER — Inpatient Hospital Stay (HOSPITAL_COMMUNITY): Payer: Medicare HMO

## 2018-12-03 LAB — BASIC METABOLIC PANEL
Anion gap: 12 (ref 5–15)
BUN: 18 mg/dL (ref 8–23)
CO2: 26 mmol/L (ref 22–32)
CREATININE: 1.13 mg/dL (ref 0.61–1.24)
Calcium: 9.9 mg/dL (ref 8.9–10.3)
Chloride: 99 mmol/L (ref 98–111)
GFR calc Af Amer: >60 mL/min (ref >60)
GFR calc non Af Amer: >60 mL/min (ref >60)
GLUCOSE: 197 mg/dL — AB (ref 70–99)
Potassium: 4.1 mmol/L (ref 3.5–5.1)
Sodium: 137 mmol/L (ref 135–145)

## 2018-12-03 LAB — CBC
HCT: 43.7 % (ref 39.0–52.0)
Hemoglobin: 14.4 g/dL (ref 13.0–17.0)
MCH: 29.5 pg (ref 26.0–34.0)
MCHC: 33 g/dL (ref 30.0–36.0)
MCV: 89.5 fL (ref 80.0–100.0)
PLATELETS: 282 10*3/uL (ref 150–400)
RBC: 4.88 MIL/uL (ref 4.22–5.81)
RDW: 13.6 % (ref 11.5–15.5)
WBC: 11.6 10*3/uL — ABNORMAL HIGH (ref 4.0–10.5)
nRBC: 0 % (ref 0.0–0.2)

## 2018-12-03 LAB — HEPARIN LEVEL (UNFRACTIONATED): Heparin Unfractionated: 0.39 IU/mL (ref 0.30–0.70)

## 2018-12-03 MED ORDER — ENSURE ENLIVE PO LIQD: 1.0000 | ORAL | 0 refills | Status: DC

## 2018-12-03 MED ORDER — RIVAROXABAN (XARELTO) VTE STARTER PACK (15 & 20 MG): ORAL_TABLET | ORAL | 0 refills | Status: DC

## 2018-12-03 MED ORDER — CALCIUM CARBONATE ANTACID 600 MG PO CHEW: 600.0000 mg | CHEWABLE_TABLET | Freq: Three times a day (TID) | ORAL | 0 refills | Status: DC | PRN

## 2018-12-03 MED ORDER — RIVAROXABAN 15 MG PO TABS
15.0000 mg | ORAL_TABLET | Freq: Two times a day (BID) | ORAL | Status: DC
  Administered 2018-12-03: 15 mg via ORAL
  Filled 2018-12-03 (×2): qty 1

## 2018-12-03 MED ORDER — IOPAMIDOL (ISOVUE-300) INJECTION 61%
100.0000 mL | Freq: Once | INTRAVENOUS | Status: AC | PRN
  Administered 2018-12-03: 100 mL via INTRAVENOUS

## 2018-12-03 MED ORDER — IOHEXOL 300 MG/ML  SOLN
100.0000 mL | Freq: Once | INTRAMUSCULAR | Status: AC
  Administered 2018-12-03: 100 mL via INTRAVENOUS

## 2018-12-03 MED ORDER — RIVAROXABAN 20 MG PO TABS: 20.0000 mg | ORAL_TABLET | Freq: Every day | ORAL | Status: DC

## 2018-12-03 MED ORDER — ACETAMINOPHEN 325 MG PO TABS: 650.0000 mg | ORAL_TABLET | Freq: Four times a day (QID) | ORAL | 0 refills | Status: DC | PRN

## 2018-12-03 NOTE — Discharge Instructions (Signed)
Information on my medicine - XARELTO (rivaroxaban)  This medication education was reviewed with me or my healthcare representative as part of my discharge preparation.   WHY WAS XARELTO PRESCRIBED FOR YOU? Xarelto was prescribed to treat blood clots that may have been found in the veins of your legs (deep vein thrombosis) or in your lungs (pulmonary embolism) and to reduce the risk of them occurring again.  What do you need to know about Xarelto? The starting dose is one 15 mg tablet taken TWICE daily with food for the FIRST 21 DAYS then on (enter date)  12/24/17  the dose is changed to one 20 mg tablet taken ONCE A DAY with your evening meal.  DO NOT stop taking Xarelto without talking to the health care provider who prescribed the medication.  Refill your prescription for 20 mg tablets before you run out.  After discharge, you should have regular check-up appointments with your healthcare provider that is prescribing your Xarelto.  In the future your dose may need to be changed if your kidney function changes by a significant amount.  What do you do if you miss a dose? If you are taking Xarelto TWICE DAILY and you miss a dose, take it as soon as you remember. You may take two 15 mg tablets (total 30 mg) at the same time then resume your regularly scheduled 15 mg twice daily the next day.  If you are taking Xarelto ONCE DAILY and you miss a dose, take it as soon as you remember on the same day then continue your regularly scheduled once daily regimen the next day. Do not take two doses of Xarelto at the same time.   Important Safety Information Xarelto is a blood thinner medicine that can cause bleeding. You should call your healthcare provider right away if you experience any of the following: ? Bleeding from an injury or your nose that does not stop. ? Unusual colored urine (red or dark brown) or unusual colored stools (red or black). ? Unusual bruising for unknown reasons. ? A  serious fall or if you hit your head (even if there is no bleeding).  Some medicines may interact with Xarelto and might increase your risk of bleeding while on Xarelto. To help avoid this, consult your healthcare provider or pharmacist prior to using any new prescription or non-prescription medications, including herbals, vitamins, non-steroidal anti-inflammatory drugs (NSAIDs) and supplements.  This website has more information on Xarelto: [email or web address]

## 2018-12-03 NOTE — Discharge Summary (Signed)
Physician Discharge Summary  Miguel Moreno QIW:[id number] DOB: [birth year] DOA: 11/29/2018  PCP: Patient, No Pcp Per  Admit date: 11/29/2018 Discharge date: 12/03/2018  Time spent: 45 minutes  Recommendations for Outpatient Follow-up:  Patient will be discharged to home.  Patient will need to follow up with primary care provider or oncologist.  Patient should continue medications as prescribed.  Patient should follow a regular diet.   Discharge Diagnoses:  Neck mass Right internal jugular vein thrombosis Tobacco abuse Hypokalemia   Discharge Condition: Stable  Diet recommendation: Regular  Filed Weights   11/29/18 1930 11/30/18 0221  Weight: 78.5 kg 75.2 kg    History of present illness:  on 11/30/2018 by Dr. Eleonore Chiquito [name],with history of anxiety and depression, heart murmur came to the hospital with complaints of right-sided neck mass. Patient says that he has experienced pain during swallowing. Denies shortness of breath. No fever or chills. No nausea vomiting or diarrhea. In the ED CT scan of the neck showed cystic/necrotic right more than left neck lymphadenopathy consistent with metastatic disease. 17 mm right palatine tonsil submucosal mass, highly suspicious for primary head and neck cancer. Oncology, Dr. Marin Olp was consulted by ED physician He recommended patient was started on IV heparin and transferred to [facility] for biopsy of neck mass.  Hospital Course:  Neck mass -suspected malignancy -Noted on CT soft tissue neck: Thick/necrotic right> left neck lymphadenopathy consistent with metastatic disease.  17 mm right palatine tonsillar submucosal mass suspicious for primary head and neck cancer.  Recommended ENT consultation.  -Hematology/ oncology consulted and appreciated, recommended CT scan of chest abdomen pelvis for evidence of metastatic disease.  -Interventional radiology consulted and appreciated, status post ultrasound core  biopsy -Oncology recommending to send biopsy for HPV testing-pending and can be followed up on as an outpatient -CT Chest/abdomen/pelvis showed 7 cm short axis left supraclavicular node, indeterminate.  Otherwise, no findings for specific metastatic disease in the chest abdomen and pelvis -Discussed with Dr. Marin Olp, oncology, recommended outpatient follow-up in [place] center.  Right internal jugular vein thrombosis -CT neck: Acute right internal jugular vein thrombosis related to lymphadenopathy. -Placed on heparin, per hematology oncology, patient may be transitioned to Xarelto -transitioned to xarelto  Tobacco abuse -discussed smoking cessation -continue nicotine  Hypokalemia  -Resolved with replacement  Consultants Hematology/oncology  Procedures  [place] core biopsy   Discharge Exam: Vitals:   12/03/18 0350 12/03/18 1314  BP: 118/63 134/81  Pulse: 76 85  Resp: 20 17  Temp: 98.7 F (37.1 C) 98.1 F (36.7 C)  SpO2: 94% 94%     General: Well developed, well nourished, NAD, appears stated age  HEENT: NCAT, mucous membranes moist.  Neck: Supple. Right-sided lymphadenopathy. Left sided fullness   Cardiovascular: S1 S2 auscultated, no murmur, RRR  Respiratory: Clear to auscultation bilaterally with equal chest rise  Abdomen: Soft, nontender, nondistended, + bowel sounds  Extremities: warm dry without cyanosis clubbing or edema  Neuro: AAOx3, nonfocal  Psych: Appropriate mood and affect  Discharge Instructions Discharge Instructions    Discharge instructions   Complete by:  As directed    Patient will be discharged to home.  Patient will need to follow up with primary care provider or oncologist.  Patient should continue medications as prescribed.  Patient should follow a regular diet.     Allergies as of 12/03/2018   No Known Allergies     Medication List    STOP taking these medications   aspirin EC 81  MG tablet   naproxen sodium 220 MG  tablet Commonly known as:  ALEVE     TAKE these medications   acetaminophen 325 MG tablet Commonly known as:  TYLENOL Take 2 tablets (650 mg total) by mouth every 6 (six) hours as needed for mild pain (or Fever >/= 101).   Calcium Carbonate Antacid 600 MG chewable tablet Commonly known as:  MAALOX Chew 1 tablet (600 mg total) by mouth 3 (three) times daily as needed for heartburn.   feeding supplement (ENSURE ENLIVE) Liqd Take 237 mLs by mouth daily.   Rivaroxaban 15 & 20 MG Tbpk Take as directed on package: Start with one 15mg  tablet by mouth twice a day with food. On Day 22, switch to one 20mg  tablet once a day with food.       No Known Allergies Follow-up Information    [facility]. Schedule an appointment as soon as possible for a visit in 1 week(s).   Specialty:  Oncology Why:  Hospital follow up  Contact information: [address] [id number] [place] [phone]           The results of significant diagnostics from this hospitalization (including imaging, microbiology, ancillary and laboratory) are listed below for reference.    Significant Diagnostic Studies: Ct Soft Tissue Neck W Contrast  Result Date: 11/29/2018 CLINICAL DATA:  RIGHT ear pain and RIGHT lymphadenopathy after cleaning ear with peroxide 3 weeks ago. EXAM: CT NECK WITH CONTRAST TECHNIQUE: Multidetector CT imaging of the neck was performed using the standard protocol following the bolus administration of intravenous contrast. CONTRAST:  33mL ISOVUE-300 IOPAMIDOL (ISOVUE-300) INJECTION 61% COMPARISON:  None. FINDINGS: PHARYNX AND LARYNX: Isodense 17 mm nodule suspected RIGHT palatine tonsil (axial image 38, coronal image 49). Widely patent airway. SALIVARY GLANDS: Normal. THYROID: Subcentimeter calcification RIGHT thyroid lobe, thyroid is otherwise unremarkable. LYMPH NODES: RIGHT level 2 necrotic/cystic nodal conglomeration measuring to 3.4 x 3.9 cm,  Dominant lymph node measuring 2.2 x 3.1 cm. LEFT level 2 B cystic 11 mm and LEFT level 3 12 mm lymph nodes. Additional smaller LEFT neck lymph nodes. VASCULAR: RIGHT internal jugular vein distension with central filling defect at level of mid neck associated with lymphadenopathy. Mild calcific atherosclerosis carotid bifurcations. LIMITED INTRACRANIAL: Normal. VISUALIZED ORBITS: LEFT ocular lens implant. MASTOIDS AND VISUALIZED PARANASAL SINUSES: Mild paranasal sinus mucosal thickening without air-fluid levels. SKELETON: Nonacute. Patient is edentulous. Mild RIGHT temporomandibular osteoarthrosis. Severe lower cervical spondylosis. Moderate cervical facet arthropathy. UPPER CHEST: Lung apices are clear. Centrilobular emphysema. No superior mediastinal lymphadenopathy. OTHER: None. IMPRESSION: 1. Cystic/necrotic RIGHT > LEFT neck lymphadenopathy consistent with metastatic disease. 2. 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 Recommend ENT consultation. 3. Acute RIGHT internal jugular vein thrombosis related to lymphadenopathy. 4. Acute findings discussed with and reconfirmed by Dr.DAVID YELVERTON on 11/29/2018 at 10:18 pm. Aortic Atherosclerosis (ICD10-I70.0). Electronically Signed   By: Elon Alas M.D.   On: 11/29/2018 22:18   Ct Chest W Contrast  Result Date: 12/03/2018 CLINICAL DATA:  Newly diagnosed head/neck cancer, for staging EXAM: CT CHEST, ABDOMEN, AND PELVIS WITH CONTRAST TECHNIQUE: Multidetector CT imaging of the chest, abdomen and pelvis was performed following the standard protocol during bolus administration of intravenous contrast. CONTRAST:  146mL ISOVUE-300 IOPAMIDOL (ISOVUE-300) INJECTION 61% COMPARISON:  None. FINDINGS: CT CHEST FINDINGS Cardiovascular: The heart is normal in size. No pericardial effusion. No evidence of thoracic aortic aneurysm. Mild atherosclerotic calcifications of the aortic  arch. Mild coronary  atherosclerosis of the LAD. Mediastinum/Nodes: No suspicious mediastinal lymphadenopathy. 9 mm short axis subcarinal node (series 3/image 34), within normal limits. No suspicious hilar or axillary lymphadenopathy. 7 mm short axis left supraclavicular node (series 3/image 6), indeterminate. Visualized thyroid is unremarkable. Lungs/Pleura: Moderate centrilobular and paraseptal emphysematous changes, upper lobe predominant. No focal consolidation. No suspicious pulmonary nodules. No pleural effusion or pneumothorax. Musculoskeletal: Visualized osseous structures are within normal limits. CT ABDOMEN PELVIS FINDINGS Hepatobiliary: Scattered probable hepatic cysts measuring up to 8 mm in the left hepatic lobe (series 3/image 56). Gallbladder is unremarkable. No intrahepatic or extrahepatic ductal dilatation. Pancreas: Within normal limits. Spleen: Scattered calcified granulomata. Adrenals/Urinary Tract: Adrenal glands are within normal limits. Bilateral renal cysts, including a 6.6 cm bilobed right lower pole renal cyst with a single thin septation (series 3/image 81), benign (Bosniak II). No hydronephrosis. Bladder is within normal limits. Stomach/Bowel: Stomach is within normal limits. No evidence of bowel obstruction. Normal appendix (series 3/image 91). Mild left colonic diverticulosis, without evidence of diverticulitis. Vascular/Lymphatic: 3.3 x 3.4 cm infrarenal abdominal aortic aneurysm (series 3/image 80). Atherosclerotic calcifications of the abdominal aorta and branch vessels. No suspicious abdominopelvic lymphadenopathy. Reproductive: Prostate is unremarkable. Other: No abdominopelvic ascites. Musculoskeletal: Degenerative changes of the lumbar spine. 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 IMPRESSION: 7 mm short axis left supraclavicular node, indeterminate. Otherwise, no findings specific for metastatic disease in the chest, abdomen, or pelvis. 3.3 x 3.4 cm infrarenal abdominal aortic aneurysm. Given known  malignancy, this is of questionable significance, and a dedicated follow-up recommendation has not been provided. Electronically Signed   By: Julian Hy M.D.   On: 12/03/2018 15:29   Ct Abdomen Pelvis W Contrast  Result Date: 12/03/2018 CLINICAL DATA:  Newly diagnosed head/neck cancer, for staging EXAM: CT CHEST, ABDOMEN, AND PELVIS WITH CONTRAST TECHNIQUE: Multidetector CT imaging of the chest, abdomen and pelvis was performed following the standard protocol during bolus administration of intravenous contrast. CONTRAST:  189mL ISOVUE-300 IOPAMIDOL (ISOVUE-300) INJECTION 61% COMPARISON:  None. FINDINGS: CT CHEST FINDINGS Cardiovascular: The heart is normal in size. No pericardial effusion. No evidence of thoracic aortic aneurysm. Mild atherosclerotic calcifications of the aortic arch. Mild coronary atherosclerosis of the LAD. Mediastinum/Nodes: No suspicious mediastinal lymphadenopathy. 9 mm short axis subcarinal node (series 3/image 34), within normal limits. No suspicious hilar or axillary lymphadenopathy. 7 mm short axis left supraclavicular node (series 3/image 6), indeterminate. Visualized thyroid is unremarkable. Lungs/Pleura: Moderate centrilobular and paraseptal emphysematous changes, upper lobe predominant. No focal consolidation. No suspicious pulmonary nodules. No pleural effusion or pneumothorax. Musculoskeletal: Visualized osseous structures are within normal limits. CT ABDOMEN PELVIS FINDINGS Hepatobiliary: Scattered probable hepatic cysts measuring up to 8 mm in the left hepatic lobe (series 3/image 56). Gallbladder is unremarkable. No intrahepatic or extrahepatic ductal dilatation. Pancreas: Within normal limits. Spleen: Scattered calcified granulomata. Adrenals/Urinary Tract: Adrenal glands are within normal limits. Bilateral renal cysts, including a 6.6 cm bilobed right lower pole renal cyst with a single thin septation (series 3/image 81), benign (Bosniak II). No hydronephrosis.  Bladder is within normal limits. Stomach/Bowel: Stomach is within normal limits. No evidence of bowel obstruction. Normal appendix (series 3/image 91). Mild left colonic diverticulosis, without evidence of diverticulitis. Vascular/Lymphatic: 3.3 x 3.4 cm infrarenal abdominal aortic aneurysm (series 3/image 80). Atherosclerotic calcifications of the abdominal aorta and branch vessels. No suspicious abdominopelvic lymphadenopathy. Reproductive: Prostate is unremarkable. Other: No abdominopelvic ascites. Musculoskeletal: Degenerative changes of the lumbar spine. 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 IMPRESSION: 7 mm short axis  left supraclavicular node, indeterminate. Otherwise, no findings specific for metastatic disease in the chest, abdomen, or pelvis. 3.3 x 3.4 cm infrarenal abdominal aortic aneurysm. Given known malignancy, this is of questionable significance, and a dedicated follow-up recommendation has not been provided. Electronically Signed   By: Julian Hy M.D.   On: 12/03/2018 15:29   [place] Core Biopsy (lymph Nodes)  Result Date: 12/02/2018 INDICATION: Right neck adenopathy EXAM: ULTRASOUND GUIDED CORE BIOPSY OF RIGHT NECK LYMPH NODE MEDICATIONS: None. ANESTHESIA/SEDATION: Fentanyl 75 mcg IV; Versed 2 mg IV Moderate Sedation Time:  10 minutes The patient was continuously monitored during the procedure by the interventional radiology nurse under my direct supervision. PROCEDURE: The procedure, risks, benefits, and alternatives were explained to the patient. Questions regarding the procedure were encouraged and answered. The patient understands and consents to the procedure. The right neck was prepped with ChloraPrep in a sterile fashion, and a sterile drape was applied covering the operative field. A sterile gown and sterile gloves were used for the procedure. Local anesthesia was provided with 1% Lidocaine. Under sonographic guidance, 3 18 gauge core biopsies of the enlarged right neck lymph node were  obtained and placed in saline. COMPLICATIONS: None immediate. FINDINGS: Images document needle placement in the right neck lymph node. IMPRESSION: Successful ultrasound-guided core biopsy of an enlarged right neck lymph node. Electronically Signed   By: Marybelle Killings M.D.   On: 12/02/2018 16:25    Microbiology: No results found for this or any previous visit (from the past 240 hour(s)).   Labs: Basic Metabolic Panel: Recent Labs  Lab 11/29/18 2012 11/30/18 0728 12/03/18 0403  NA 137 138 137  K 3.4* 4.2 4.1  CL 101 104 99  CO2 25 23 26   GLUCOSE 96 95 197*  BUN 13 13 18   CREATININE 1.00 0.96 1.13  CALCIUM 9.3 8.7* 9.9   Liver Function Tests: Recent Labs  Lab 11/30/18 0728  AST 14*  ALT 10  ALKPHOS 56  BILITOT 1.0  PROT 6.6  ALBUMIN 3.7   No results for input(s): LIPASE, AMYLASE in the last 168 hours. No results for input(s): AMMONIA in the last 168 hours. CBC: Recent Labs  Lab 11/29/18 2012 11/30/18 0728 12/01/18 0448 12/02/18 0345 12/03/18 0403  WBC 11.8* 11.7* 10.3 10.9* 11.6*  NEUTROABS 7.7  --   --   --   --   HGB 16.3 14.8 14.8 14.4 14.4  HCT 49.5 44.0 45.5 43.9 43.7  MCV 89.5 88.2 91.0 88.3 89.5  PLT 319 272 284 287 282   Cardiac Enzymes: No results for input(s): CKTOTAL, CKMB, CKMBINDEX, TROPONINI in the last 168 hours. BNP: BNP (last 3 results) No results for input(s): BNP in the last 8760 hours.  ProBNP (last 3 results) No results for input(s): PROBNP in the last 8760 hours.  CBG: No results for input(s): GLUCAP in the last 168 hours.     Signed:  Cristal Ford  [facility] 12/03/2018, 4:57 PM

## 2018-12-03 NOTE — Progress Notes (Addendum)
ANTICOAGULATION CONSULT NOTE  Pharmacy Consult:  Heparin Indication: DVT treatment  No Known Allergies  Patient Measurements: Height: 5\' 4"  (162.6 cm) Weight: 165 lb 12.6 oz (75.2 kg) IBW/kg (Calculated) : 59.2 HEPARIN DW (KG): 75.3  Vital Signs: Temp: 98.7 F (37.1 C) (12/17 0350) Temp Source: Oral (12/17 0350) BP: 118/63 (12/17 0350) Pulse Rate: 76 (12/17 0350)  Labs: Recent Labs    11/30/18 0728  12/01/18 0448 12/02/18 0345 12/03/18 0403  HGB 14.8  --  14.8 14.4 14.4  HCT 44.0  --  45.5 43.9 43.7  PLT 272  --  284 287 282  APTT  --   --   --  112*  --   LABPROT  --   --   --  12.7  --   INR  --   --   --  0.96  --   HEPARINUNFRC 0.65   < > 0.55 0.54 0.39  CREATININE 0.96  --   --   --  1.13   < > = values in this interval not displayed.   Estimated Creatinine Clearance: 57.2 mL/min (by C-G formula based on SCr of 1.13 mg/dL).   Assessment: 46 YOM seen in the ED for left neck mass. CT shows internal jugular thrombosis and Pharmacy has been consulted for IV heparin dosing.  Heparin level therapeutic and stable.  No bleeding reported.  Goal of Therapy:  Heparin goal level: 0.3-0.7 unit/ml Monitor platelets by anticoagulation protocol: Yes   Plan:  Continue heparin gtt at 1250 units/hr Daily heparin level and CBC F/U plans for oral [facility]    Ester Mabe D. Mina Marble, PharmD, BCPS, [place] 12/03/2018, 7:25 AM  ========================================  Addendum: Transition patient to Xarelto.  RN to stop heparin when Xarelto is administered. Xarelto 15mg  PO BIDWM x21 days, then on 12/24/17 start 20mg  PO daily with supper Pharmacy will sign off and follow peripherally.  Thank you for the consult!   Vidya Bamford D. Mina Marble, PharmD, BCPS, [place] 12/03/2018, 12:45 PM

## 2018-12-03 NOTE — Progress Notes (Signed)
Discharge instructions and prescriptions reviewed with patient. Patient verbalized understanding. IV removed, pt dressed in room with belongings packed up, waiting for his ride to arrive. No complaints at this time. Will continue to monitor.

## 2018-12-15 ENCOUNTER — Encounter (HOSPITAL_COMMUNITY): Payer: Self-pay | Admitting: Emergency Medicine

## 2018-12-15 ENCOUNTER — Inpatient Hospital Stay (HOSPITAL_COMMUNITY)
Admission: EM | Admit: 2018-12-15 | Discharge: 2018-12-17 | DRG: 378 | Disposition: A | Discharge status: Home / Self Care | Payer: Medicare HMO | Attending: Internal Medicine | Admitting: Internal Medicine

## 2018-12-15 ENCOUNTER — Other Ambulatory Visit: Payer: Self-pay

## 2018-12-15 DIAGNOSIS — K279 Peptic ulcer, site unspecified, unspecified as acute or chronic, without hemorrhage or perforation: Secondary | ICD-10-CM | POA: Diagnosis present

## 2018-12-15 DIAGNOSIS — I714 Abdominal aortic aneurysm, without rupture: Secondary | ICD-10-CM | POA: Diagnosis present

## 2018-12-15 DIAGNOSIS — G8929 Other chronic pain: Secondary | ICD-10-CM | POA: Diagnosis present

## 2018-12-15 DIAGNOSIS — F17210 Nicotine dependence, cigarettes, uncomplicated: Secondary | ICD-10-CM | POA: Diagnosis present

## 2018-12-15 DIAGNOSIS — R221 Localized swelling, mass and lump, neck: Secondary | ICD-10-CM

## 2018-12-15 DIAGNOSIS — T39395A Adverse effect of other nonsteroidal anti-inflammatory drugs [NSAID], initial encounter: Secondary | ICD-10-CM | POA: Diagnosis present

## 2018-12-15 DIAGNOSIS — Z8711 Personal history of peptic ulcer disease: Secondary | ICD-10-CM | POA: Diagnosis not present

## 2018-12-15 DIAGNOSIS — I8290 Acute embolism and thrombosis of unspecified vein: Secondary | ICD-10-CM | POA: Diagnosis not present

## 2018-12-15 DIAGNOSIS — K921 Melena: Secondary | ICD-10-CM

## 2018-12-15 DIAGNOSIS — K5909 Other constipation: Secondary | ICD-10-CM | POA: Diagnosis present

## 2018-12-15 DIAGNOSIS — Z7901 Long term (current) use of anticoagulants: Secondary | ICD-10-CM | POA: Diagnosis not present

## 2018-12-15 DIAGNOSIS — I82C11 Acute embolism and thrombosis of right internal jugular vein: Secondary | ICD-10-CM | POA: Diagnosis present

## 2018-12-15 DIAGNOSIS — C760 Malignant neoplasm of head, face and neck: Secondary | ICD-10-CM | POA: Diagnosis present

## 2018-12-15 DIAGNOSIS — K922 Gastrointestinal hemorrhage, unspecified: Secondary | ICD-10-CM | POA: Diagnosis not present

## 2018-12-15 DIAGNOSIS — J370 Chronic laryngitis: Secondary | ICD-10-CM | POA: Diagnosis present

## 2018-12-15 HISTORY — DX: Low back pain, unspecified: M54.50

## 2018-12-15 HISTORY — DX: Gastro-esophageal reflux disease without esophagitis: K21.9

## 2018-12-15 HISTORY — DX: Melena: K92.1

## 2018-12-15 HISTORY — DX: Low back pain: M54.5

## 2018-12-15 HISTORY — DX: Headache: R51

## 2018-12-15 HISTORY — DX: Unspecified osteoarthritis, unspecified site: M19.90

## 2018-12-15 HISTORY — DX: Headache, unspecified: R51.9

## 2018-12-15 HISTORY — DX: Acute embolism and thrombosis of unspecified internal jugular vein: I82.C19

## 2018-12-15 HISTORY — DX: Pneumonia, unspecified organism: J18.9

## 2018-12-15 HISTORY — DX: Other chronic pain: G89.29

## 2018-12-15 HISTORY — DX: Squamous cell carcinoma of skin, unspecified: C44.92

## 2018-12-15 HISTORY — DX: Tinnitus, bilateral: H93.13

## 2018-12-15 LAB — URINALYSIS, ROUTINE W REFLEX MICROSCOPIC
Bacteria, UA: NONE SEEN
Bilirubin Urine: NEGATIVE
Glucose, UA: NEGATIVE mg/dL
Ketones, ur: NEGATIVE mg/dL
Leukocytes, UA: NEGATIVE
Nitrite: NEGATIVE
Protein, ur: NEGATIVE mg/dL
Specific Gravity, Urine: 1.011 (ref 1.005–1.030)
pH: 6 (ref 5.0–8.0)

## 2018-12-15 LAB — TYPE AND SCREEN
ABO/RH(D): O POS
Antibody Screen: NEGATIVE

## 2018-12-15 LAB — CBC WITH DIFFERENTIAL/PLATELET
Abs Immature Granulocytes: 0.09 10*3/uL — ABNORMAL HIGH (ref 0.00–0.07)
Basophils Absolute: 0.1 10*3/uL (ref 0.0–0.1)
Basophils Relative: 0 %
Eosinophils Absolute: 0.4 10*3/uL (ref 0.0–0.5)
Eosinophils Relative: 3 %
HCT: 45 % (ref 39.0–52.0)
Hemoglobin: 15.2 g/dL (ref 13.0–17.0)
Immature Granulocytes: 1 %
Lymphocytes Relative: 14 %
Lymphs Abs: 1.9 10*3/uL (ref 0.7–4.0)
MCH: 30.3 pg (ref 26.0–34.0)
MCHC: 33.8 g/dL (ref 30.0–36.0)
MCV: 89.8 fL (ref 80.0–100.0)
MONO ABS: 0.8 10*3/uL (ref 0.1–1.0)
Monocytes Relative: 6 %
NEUTROS ABS: 9.8 10*3/uL — AB (ref 1.7–7.7)
Neutrophils Relative %: 76 %
Platelets: 346 10*3/uL (ref 150–400)
RBC: 5.01 MIL/uL (ref 4.22–5.81)
RDW: 13.6 % (ref 11.5–15.5)
WBC: 12.9 10*3/uL — ABNORMAL HIGH (ref 4.0–10.5)
nRBC: 0 % (ref 0.0–0.2)

## 2018-12-15 LAB — COMPREHENSIVE METABOLIC PANEL
ALT: 14 U/L (ref 0–44)
AST: 17 U/L (ref 15–41)
Albumin: 4.2 g/dL (ref 3.5–5.0)
Alkaline Phosphatase: 59 U/L (ref 38–126)
Anion gap: 9 (ref 5–15)
BILIRUBIN TOTAL: 0.5 mg/dL (ref 0.3–1.2)
BUN: 29 mg/dL — AB (ref 8–23)
CO2: 21 mmol/L — ABNORMAL LOW (ref 22–32)
Calcium: 9.2 mg/dL (ref 8.9–10.3)
Chloride: 104 mmol/L (ref 98–111)
Creatinine, Ser: 1.14 mg/dL (ref 0.61–1.24)
GFR calc Af Amer: >60 mL/min (ref >60)
GFR calc non Af Amer: >60 mL/min (ref >60)
Glucose, Bld: 129 mg/dL — ABNORMAL HIGH (ref 70–99)
POTASSIUM: 3.6 mmol/L (ref 3.5–5.1)
Sodium: 134 mmol/L — ABNORMAL LOW (ref 135–145)
Total Protein: 7.8 g/dL (ref 6.5–8.1)

## 2018-12-15 LAB — POC OCCULT BLOOD, ED: Fecal Occult Bld: POSITIVE — AB

## 2018-12-15 LAB — LIPASE, BLOOD: Lipase: 31 U/L (ref 11–51)

## 2018-12-15 MED ORDER — SODIUM CHLORIDE 0.9% FLUSH
3.0000 mL | Freq: Two times a day (BID) | INTRAVENOUS | Status: DC
  Administered 2018-12-15 – 2018-12-16 (×4): 3 mL via INTRAVENOUS

## 2018-12-15 MED ORDER — PANTOPRAZOLE SODIUM 40 MG IV SOLR
40.0000 mg | Freq: Two times a day (BID) | INTRAVENOUS | Status: DC
  Administered 2018-12-15 – 2018-12-16 (×3): 40 mg via INTRAVENOUS
  Filled 2018-12-15 (×4): qty 40

## 2018-12-15 MED ORDER — SODIUM CHLORIDE 0.9% FLUSH: 3.0000 mL | INTRAVENOUS | Status: DC | PRN

## 2018-12-15 MED ORDER — SODIUM CHLORIDE 0.9 % IV SOLN: 250.0000 mL | INTRAVENOUS | Status: DC | PRN

## 2018-12-15 MED ORDER — SODIUM CHLORIDE 0.9 % IV SOLN
INTRAVENOUS | Status: DC
  Administered 2018-12-15: 13:00:00 via INTRAVENOUS

## 2018-12-15 MED ORDER — PANTOPRAZOLE SODIUM 40 MG IV SOLR
40.0000 mg | Freq: Once | INTRAVENOUS | Status: AC
  Administered 2018-12-15: 40 mg via INTRAVENOUS
  Filled 2018-12-15: qty 40

## 2018-12-15 NOTE — ED Triage Notes (Signed)
Pt was started on xalato for a blood clot in his neck but now he is having black stools.

## 2018-12-15 NOTE — Progress Notes (Signed)
Patient ID: Miguel Moreno, male   DOB: 12-27-1950, 67 y.o.   MRN: [id number]  CALLED BY DR. ZACKOWSKI. PT IN ED WITH BLACK STOOL. HAS A KNOWN HEAD/NECK CANCER THAT HAS NOT BEEN EVALUATED BY ENT. STARTED ON ELIQUIS ~2 WEEKS AGO AFTER CT NECK REVEALED ACUTE THROMBOSIS OF THE R IJ ASSOCIATED WITH MALIGNANT LYMPHADENOPATHY. SOURCE OF MELENA LIKELY DUE TO BLEEDING FROM NECROTIC TONSIL, LESS LIKELY PUD.  BUT PT HAS BEEN TAKING NSAIDs. NO ENT AT [facility]. PT EXCEEDS OUR CAPACITY TO CARE FOR HIM AT [facility] DUE TO NEED FOR COORDINATED CARE WITH IR, ENT, AND GI. RECOMMEND TRANSFER TO [place]: [facility] OR [facility].

## 2018-12-15 NOTE — H&P (Signed)
History and Physical    [name] XNA:[id number] DOB: 02/16/1951 DOA: 12/15/2018  PCP: Patient, No Pcp Per  Patient coming from: Home   Chief Complaint: Blood in stool  HPI: Miguel Moreno is a 67 y.o. male with medical history significant of recent left neck mass hospitalization at [facility] and had a biopsy of the area suspicious for metastatic cancer comes in with melanotic stools.  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  He has had very dark melanotic stools since yesterday.  He has been having a lot of nausea but no vomiting.  He has not had any bright red blood per rectum.  He denies occasional use of NSAIDs over-the-counter for pain.  He is grossly heme positive on rectal exam.  Dr. Oneida Alar here at [facility], GI was called who deferred patient to [facility] because of the neck mass.  Patient is being referred for admission for GI bleed.  Review of Systems: As per HPI otherwise 10 point review of systems negative.   Past Medical History:  Diagnosis Date  . Anxiety and depression   . Blood clot in vein   . Heart murmur     Past Surgical History:  Procedure Laterality Date  . CATARACT EXTRACTION       reports that he has been smoking cigarettes. He has a 20.00 pack-year smoking history. He has never used smokeless tobacco. He reports that he does not drink alcohol or use drugs.  No Known Allergies  No family history on file.  No premature coronary artery disease  Prior to Admission medications   Medication Sig Start Date End Date Taking? Authorizing Provider  ibuprofen (ADVIL,MOTRIN) 200 MG tablet Take 400 mg by mouth every 6 (six) hours as needed.   Yes [provider]  Rivaroxaban 15 & 20 MG TBPK Take as directed on package: Start with one 15mg  tablet by mouth twice a day with food. On Day 22, switch to one 20mg  tablet once a day with food. 12/03/18  Yes Mikhail, Velta Addison, DO  Calcium Carbonate Antacid (MAALOX) 600 MG  chewable tablet Chew 1 tablet (600 mg total) by mouth 3 (three) times daily as needed for heartburn. Patient not taking: Reported on 12/15/2018 12/03/18   Cristal Ford, DO  feeding supplement, ENSURE ENLIVE, (ENSURE ENLIVE) LIQD Take 237 mLs by mouth daily. Patient not taking: Reported on 12/15/2018 12/03/18   Cristal Ford, DO    Physical Exam: Vitals:   12/15/18 1246 12/15/18 1247 12/15/18 1300 12/15/18 1400  BP: 118/85  136/82 113/82  Pulse:  84 91   Resp: 14 (!) 23 18 (!) 21  Temp:      TempSrc:      SpO2:  95% 96%   Weight:      Height:          Constitutional: NAD, calm, comfortable Vitals:   12/15/18 1246 12/15/18 1247 12/15/18 1300 12/15/18 1400  BP: 118/85  136/82 113/82  Pulse:  84 91   Resp: 14 (!) 23 18 (!) 21  Temp:      TempSrc:      SpO2:  95% 96%   Weight:      Height:       Eyes: PERRL, lids and conjunctivae normal ENMT: Mucous membranes are moist. Posterior pharynx clear of any exudate or lesions.Normal dentition.  Neck: Left neck mass no thyromegaly Respiratory: clear to auscultation bilaterally, no wheezing, no crackles. Normal respiratory effort. No accessory muscle  use.  Cardiovascular: Regular rate and rhythm, no murmurs / rubs / gallops. No extremity edema. 2+ pedal pulses. No carotid bruits.  Abdomen: no tenderness, no masses palpated. No hepatosplenomegaly. Bowel sounds positive.  Musculoskeletal: no clubbing / cyanosis. No joint deformity upper and lower extremities. Good ROM, no contractures. Normal muscle tone.  Skin: no rashes, lesions, ulcers. No induration Neurologic: CN 2-12 grossly intact. Sensation intact, DTR normal. Strength 5/5 in all 4.  Psychiatric: Normal judgment and insight. Alert and oriented x 3. Normal mood.    Labs on Admission: I have personally reviewed following labs and imaging studies  CBC: Recent Labs  Lab 12/15/18 1152  WBC 12.9*  NEUTROABS 9.8*  HGB 15.2  HCT 45.0  MCV 89.8  PLT 742   Basic  Metabolic Panel: Recent Labs  Lab 12/15/18 1152  NA 134*  K 3.6  CL 104  CO2 21*  GLUCOSE 129*  BUN 29*  CREATININE 1.14  CALCIUM 9.2   GFR: Estimated Creatinine Clearance: 59.3 mL/min (by C-G formula based on SCr of 1.14 mg/dL). Liver Function Tests: Recent Labs  Lab 12/15/18 1152  AST 17  ALT 14  ALKPHOS 59  BILITOT 0.5  PROT 7.8  ALBUMIN 4.2   Recent Labs  Lab 12/15/18 1152  LIPASE 31   No results for input(s): AMMONIA in the last 168 hours. Coagulation Profile: No results for input(s): INR, PROTIME in the last 168 hours. Cardiac Enzymes: No results for input(s): CKTOTAL, CKMB, CKMBINDEX, TROPONINI in the last 168 hours. BNP (last 3 results) No results for input(s): PROBNP in the last 8760 hours. HbA1C: No results for input(s): HGBA1C in the last 72 hours. CBG: No results for input(s): GLUCAP in the last 168 hours. Lipid Profile: No results for input(s): CHOL, HDL, LDLCALC, TRIG, CHOLHDL, LDLDIRECT in the last 72 hours. Thyroid Function Tests: No results for input(s): TSH, T4TOTAL, FREET4, T3FREE, THYROIDAB in the last 72 hours. Anemia Panel: No results for input(s): VITAMINB12, FOLATE, FERRITIN, TIBC, IRON, RETICCTPCT in the last 72 hours. Urine analysis:    Component Value Date/Time   COLORURINE YELLOW 12/15/2018 1251   APPEARANCEUR CLEAR 12/15/2018 1251   LABSPEC 1.011 12/15/2018 1251   PHURINE 6.0 12/15/2018 1251   GLUCOSEU NEGATIVE 12/15/2018 1251   HGBUR MODERATE (A) 12/15/2018 1251   BILIRUBINUR NEGATIVE 12/15/2018 1251   KETONESUR NEGATIVE 12/15/2018 1251   PROTEINUR NEGATIVE 12/15/2018 1251   NITRITE NEGATIVE 12/15/2018 1251   LEUKOCYTESUR NEGATIVE 12/15/2018 1251   Sepsis Labs: !!!!!!!!!!!!!!!!!!!!!!!!!!!!!!!!!!!!!!!!!!!! @LABRCNTIP (procalcitonin:4,lacticidven:4) )No results found for this or any previous visit (from the past 240 hour(s)).   Radiological Exams on Admission: No results found.  Old chart reviewed Case cussed Dr.  Bobby Rumpf in the ED  Assessment/Plan 67 year old male with recent biopsy of his neck mass for possible cancer comes in with melanotic stools Principal Problem:   GIB (gastrointestinal bleeding)-stable melanotic stools.  Hemoglobin is over 15.  No overt bleeding at this time.  Stop blood thinners.  Will not reverse at this time due to his stability.  Will place on Protonix 40 mg IV every 12 hours.  Will transfer to [facility] for further GI evaluation.  GI has been called by Dr. Bobby Rumpf please see Dr. Jaclyn Shaggy note for details of that discussion.  Active Problems:   Neck mass-ENT is also been called by Dr. Bobby Rumpf please see his note for full details.  Do not think that that is neck mass will interfere with any EGD if needed.    Blood clot  in vein-holding blood thinners at this time.  Holding off on reversal agents.      DVT prophylaxis: SCDs Code Status: Full Family Communication: None Disposition Plan: 1 to 3 days Consults called: GI and ENT Admission status: Admission   Cora Brierley A MD [facility]  If 7PM-7AM, please contact night-coverage [email or web address] Password [email or web address]  12/15/2018, 2:18 PM

## 2018-12-15 NOTE — ED Provider Notes (Signed)
[facility] EMERGENCY DEPARTMENT Provider Note   CSN: [id number] Arrival date & time: 12/15/18  0945     History   Chief Complaint Chief Complaint  Patient presents with  . Melena    HPI Miguel Moreno is a 67 y.o. male.  Patient presenting with a complaint of black tarry stools for the past couple days.  Still having some today.  Patient is on the blood thinner Xarelto.  Patient with recent admission to [facility] admission was December 13 through December 17.  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  Final path is pending.  Patient has follow-up with hematology oncology appear on December 31.  Patient started on Xarelto because in the work-up they identified that he had thrombosis of his internal jugular vein.  On that side.  Patient states that he had a little bit of epigastric discomfort following this melena and he has been taking Motrin.  He is taking his Xarelto.  Denies any chest pain.  Not spitting up any blood no blood in his mouth.  Patient was consulted on by ENT during this admission but was not actually seen by them.  Was seen by Dr. Marin Olp from hematology oncology.  And will continue his heme-onc follow-up here at any [facility] heme-onc clinic.     Past Medical History:  Diagnosis Date  . Anxiety and depression   . Blood clot in vein   . Heart murmur     Patient Active Problem List   Diagnosis Date Noted  . Neck mass 11/30/2018  . Tobacco abuse 11/30/2018  . Anxiety state 11/30/2018    Past Surgical History:  Procedure Laterality Date  . CATARACT EXTRACTION          Home Medications    Prior to Admission medications   Medication Sig Start Date End Date Taking? Authorizing Provider  ibuprofen (ADVIL,MOTRIN) 200 MG tablet Take 400 mg by mouth every 6 (six) hours as needed.   Yes [provider]  Rivaroxaban 15 & 20 MG TBPK Take as directed on package: Start with one 15mg  tablet by mouth  twice a day with food. On Day 22, switch to one 20mg  tablet once a day with food. 12/03/18  Yes Mikhail, Velta Addison, DO  Calcium Carbonate Antacid (MAALOX) 600 MG chewable tablet Chew 1 tablet (600 mg total) by mouth 3 (three) times daily as needed for heartburn. Patient not taking: Reported on 12/15/2018 12/03/18   Cristal Ford, DO  feeding supplement, ENSURE ENLIVE, (ENSURE ENLIVE) LIQD Take 237 mLs by mouth daily. Patient not taking: Reported on 12/15/2018 12/03/18   Cristal Ford, DO    Family History No family history on file.  Social History Social History   Tobacco Use  . Smoking status: Current Every Day Smoker    Packs/day: 1.00    Years: 20.00    Pack years: 20.00    Types: Cigarettes  . Smokeless tobacco: Never Used  Substance Use Topics  . Alcohol use: Never    Frequency: Never  . Drug use: Never     Allergies   Patient has no known allergies.   Review of Systems Review of Systems  Constitutional: Negative for fever.  HENT: Negative for congestion and trouble swallowing.   Eyes: Negative for redness.  Respiratory: Negative for shortness of breath.   Cardiovascular: Negative for chest pain.  Gastrointestinal: Positive for abdominal pain and blood in stool. Negative for nausea and rectal  pain.  Genitourinary: Negative for dysuria.  Musculoskeletal: Negative for back pain.  Skin: Negative for rash and wound.  Neurological: Negative for dizziness, syncope, speech difficulty, weakness, numbness and headaches.  Hematological: Bruises/bleeds easily.  Psychiatric/Behavioral: Negative for confusion.     Physical Exam Updated Vital Signs BP 118/85   Pulse 84   Temp 97.7 F (36.5 C) (Oral)   Resp (!) 23   Ht 1.651 m (5\' 5" )   Wt 79.2 kg   SpO2 95%   BMI 29.05 kg/m   Physical Exam Vitals signs and nursing note reviewed.  Constitutional:      General: He is not in acute distress.    Appearance: Normal appearance.  HENT:     Head: Normocephalic and  atraumatic.     Mouth/Throat:     Mouth: Mucous membranes are moist.     Pharynx: Oropharynx is clear. No oropharyngeal exudate or posterior oropharyngeal erythema.     Comments: Examination of oropharynx without evidence of any bleeding.  No obvious mucosal abnormalities.  Despite the diagnosis of sub-tonsillar probably source for head neck cancer.  Uvula is midline. Neck:     Musculoskeletal: Normal range of motion and neck supple. No muscular tenderness.     Comments: Some fullness to the left side of the neck at the angle of the jaw.  No erythema.  No wound.  No tenderness. Cardiovascular:     Rate and Rhythm: Normal rate and regular rhythm.  Pulmonary:     Effort: Pulmonary effort is normal. No respiratory distress.     Breath sounds: Normal breath sounds.  Abdominal:     General: Bowel sounds are normal.     Palpations: Abdomen is soft.     Tenderness: There is no abdominal tenderness.  Genitourinary:    Rectum: Normal. Guaiac result positive.     Comments: Stool very dark in color not maroon or red.  Heme positive. Musculoskeletal: Normal range of motion.        General: No swelling.  Skin:    General: Skin is warm.     Capillary Refill: Capillary refill takes less than 2 seconds.     Findings: No erythema or rash.  Neurological:     General: No focal deficit present.     Mental Status: He is alert and oriented to person, place, and time.     Cranial Nerves: No cranial nerve deficit.     Sensory: No sensory deficit.     Motor: No weakness.      ED Treatments / Results  Labs (all labs ordered are listed, but only abnormal results are displayed) Labs Reviewed  COMPREHENSIVE METABOLIC PANEL - Abnormal; Notable for the following components:      Result Value   Sodium 134 (*)    CO2 21 (*)    Glucose, Bld 129 (*)    BUN 29 (*)    All other components within normal limits  CBC WITH DIFFERENTIAL/PLATELET - Abnormal; Notable for the following components:   WBC 12.9 (*)     Neutro Abs 9.8 (*)    Abs Immature Granulocytes 0.09 (*)    All other components within normal limits  LIPASE, BLOOD  URINALYSIS, ROUTINE W REFLEX MICROSCOPIC  POC OCCULT BLOOD, ED  TYPE AND SCREEN    EKG EKG Interpretation  Date/Time:  Sunday December 15 2018 10:49:48 EST Ventricular Rate:  102 PR Interval:    QRS Duration: 81 QT Interval:  340 QTC Calculation: 443 R Axis:  19 Text Interpretation:  Sinus tachycardia Abnormal R-wave progression, early transition Left ventricular hypertrophy No previous ECGs available Confirmed by Fredia Sorrow [phone]) on 12/15/2018 12:39:15 PM   Radiology No results found.  Procedures Procedures (including critical care time)  Medications Ordered in ED Medications  0.9 %  sodium chloride infusion ( Intravenous New Bag/Given 12/15/18 1249)     Initial Impression / Assessment and Plan / ED Course  I have reviewed the triage vital signs and the nursing notes.  Pertinent labs & imaging results that were available during my care of the patient were reviewed by me and considered in my medical decision making (see chart for details).    Patient initially discussed with Dr. Oneida Alar from GI due to his head neck cancer and complicating factor she wanted him admitted to [facility].  Hospitalist contacted they will make arrangements for hospitalist admission there.  Also spoke with Dr. Janace Hoard from ear nose and throat.  He says that he will consult if they contact him.  Some of his work-up can be done as an outpatient.  Patient's bleed is more concerning to me clinically to be perhaps ulcer has had a history of ulcers in the past.  Had some mild epigastric discomfort abdomen here today soft and nontender.  Definitely heme positive stool hemodynamically stable not tachycardic not hypotensive.  Hospitalist agree that does not require reversal for being on the Xarelto at this time.  Patient's hemoglobin is 15.2.  Patient hemodynamically  stable.-   Final Clinical Impressions(s) / ED Diagnoses   Final diagnoses:  Melena    ED Discharge Orders    None       Fredia Sorrow, MD 12/15/18 [phone]

## 2018-12-16 ENCOUNTER — Encounter (HOSPITAL_COMMUNITY): Payer: Self-pay | Admitting: General Practice

## 2018-12-16 DIAGNOSIS — K922 Gastrointestinal hemorrhage, unspecified: Secondary | ICD-10-CM

## 2018-12-16 DIAGNOSIS — I8290 Acute embolism and thrombosis of unspecified vein: Secondary | ICD-10-CM

## 2018-12-16 DIAGNOSIS — F17210 Nicotine dependence, cigarettes, uncomplicated: Secondary | ICD-10-CM

## 2018-12-16 LAB — CBC
HCT: 44.2 % (ref 39.0–52.0)
HEMOGLOBIN: 15.2 g/dL (ref 13.0–17.0)
MCH: 30.5 pg (ref 26.0–34.0)
MCHC: 34.4 g/dL (ref 30.0–36.0)
MCV: 88.8 fL (ref 80.0–100.0)
Platelets: 329 10*3/uL (ref 150–400)
RBC: 4.98 MIL/uL (ref 4.22–5.81)
RDW: 13.8 % (ref 11.5–15.5)
WBC: 12.8 10*3/uL — ABNORMAL HIGH (ref 4.0–10.5)
nRBC: 0 % (ref 0.0–0.2)

## 2018-12-16 MED ORDER — TRAMADOL HCL 50 MG PO TABS
100.0000 mg | ORAL_TABLET | Freq: Four times a day (QID) | ORAL | Status: DC | PRN
  Administered 2018-12-16: 100 mg via ORAL
  Filled 2018-12-16: qty 2

## 2018-12-16 MED ORDER — LIDOCAINE HCL 4 % EX SOLN
0.0000 mL | Freq: Once | CUTANEOUS | Status: DC | PRN
  Filled 2018-12-16: qty 50

## 2018-12-16 MED ORDER — POLYETHYLENE GLYCOL 3350 17 G PO PACK
17.0000 g | PACK | Freq: Two times a day (BID) | ORAL | Status: DC
  Administered 2018-12-16: 17 g via ORAL
  Filled 2018-12-16: qty 1

## 2018-12-16 MED ORDER — OXYMETAZOLINE HCL 0.05 % NA SOLN
1.0000 | Freq: Once | NASAL | Status: DC | PRN
  Filled 2018-12-16: qty 15

## 2018-12-16 MED ORDER — TRIPLE ANTIBIOTIC 3.5-400-5000 EX OINT
1.0000 "application " | TOPICAL_OINTMENT | Freq: Once | CUTANEOUS | Status: DC | PRN
  Filled 2018-12-16: qty 1

## 2018-12-16 MED ORDER — SILVER NITRATE-POT NITRATE 75-25 % EX MISC
1.0000 | Freq: Once | CUTANEOUS | Status: DC | PRN
  Filled 2018-12-16: qty 1

## 2018-12-16 MED ORDER — LACTULOSE 10 GM/15ML PO SOLN
20.0000 g | Freq: Once | ORAL | Status: AC
  Administered 2018-12-16: 20 g via ORAL
  Filled 2018-12-16: qty 30

## 2018-12-16 MED ORDER — LIDOCAINE HCL URETHRAL/MUCOSAL 2 % EX GEL
1.0000 "application " | Freq: Once | CUTANEOUS | Status: DC | PRN
  Filled 2018-12-16: qty 30

## 2018-12-16 MED ORDER — LIDOCAINE-EPINEPHRINE (PF) 1 %-1:200000 IJ SOLN
0.0000 mL | Freq: Once | INTRAMUSCULAR | Status: DC | PRN
  Filled 2018-12-16: qty 30

## 2018-12-16 NOTE — Consult Note (Signed)
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 Melena  : I am not sure why they sent me down here.  HPI: Miguel Moreno is well-known to me.  He is a 67 year old white male.  I actually saw him when he was hospitalized in mid December.  He came in because he had a lump in the right side of his neck.  He also had a thrombus in the right jugular vein.  He is had a biopsy of the right neck lymph node.  This was positive for squamous cell carcinoma.  No other studies were done.  He was supposed to see the oncologist in [place] tomorrow.  He has been taking some ibuprofen along with the Xarelto.  He has chronic pain issues.  He began to have some bleeding.  His stools were black.  He had no hematemesis.  He went to the emergency room at [facility].  The gastroenterologist did not do any invasive studies.  She felt that he was bleeding from the tonsillar primary.  I am not sure how this was deduced.  He was never anemic.  On 12/15/2018, his white cell count was 12.9 hemoglobin 15.2 platelet count 246,000.  He has been seen by ENT.  I think that they did a bedside endoscopy.  He had a right tonsil without any necrosis.  The airway looked okay.  He feels well.  He says he not bleeding.  He is on clear liquid diet.  I agree with ENT that he does need to have a PET scan done.  By the looks of the CAT scan, looks like he has bilateral lymphadenopathy in the neck.  As such, not sure if he would be a candidate for surgical intervention.  He is still smoking.  He is trying to cut back.  Overall, his performance status is ECOG 1.   Past Medical History:  Diagnosis Date  . Anxiety and depression   . Arthritis    "all over; joints" (12/16/2018)  . Chronic lower back pain   . GERD (gastroesophageal reflux disease)    "constantly" (12/16/2018)  . Heart murmur   . Internal jugular vein thrombosis ([place])    Miguel Moreno 12/16/2018  . Melena 12/15/2018  . Pneumonia    "3 times when I was a younger kid" (12/16/2018)  . SCC (squamous cell carcinoma)     left neck; "recent bx" (12/16/2018)  . Sinus headache    "a few/wk" (12/16/2018)  . Tinnitus, bilateral    "constantly" (12/16/2018)  :  Past Surgical History:  Procedure Laterality Date  . CATARACT EXTRACTION W/ INTRAOCULAR LENS IMPLANT Left   . SKIN BIOPSY Left 2019   neck  :   Current Facility-Administered Medications:  .  0.9 %  sodium chloride infusion, 250 mL, Intravenous, PRN, Derrill Kay A, MD .  lactulose (CHRONULAC) 10 GM/15ML solution 20 g, 20 g, Oral, Once, Jeramine Delis, Rudell Cobb, MD .  lidocaine (XYLOCAINE) 2 % jelly 1 application, 1 application, Topical, Once PRN, Jodi Marble, MD .  lidocaine (XYLOCAINE) 4 % external solution 0-50 mL, 0-50 mL, Topical, Once PRN, Jodi Marble, MD .  lidocaine-EPINEPHrine (XYLOCAINE-EPINEPHrine) 1 %-1:200000 (PF) injection 0-30 mL, 0-30 mL, Intradermal, Once PRN, Jodi Marble, MD .  oxymetazoline (AFRIN) 0.05 % nasal spray 1 spray, 1 spray, Each Nare, Once PRN, Wol[place],  Ileene Hutchinson, MD .  pantoprazole (PROTONIX) injection 40 mg, 40 mg, Intravenous, Q12H, Derrill Kay A, MD, 40 mg at 12/16/18 1000 .  polyethylene glycol (MIRALAX / GLYCOLAX) packet 17 g, 17 g, Oral, BID, Donnamae Muil[place], Rudell Cobb, MD .  silver nitrate applicators applicator 1 Stick, 1 Stick, Topical, Once PRN, Jodi Marble, MD .  sodium chloride flush (NS) 0.9 % injection 3 mL, 3 mL, Intravenous, Q12H, Derrill Kay A, MD, 3 mL at 12/16/18 1001 .  sodium chloride flush (NS) 0.9 % injection 3 mL, 3 mL, Intravenous, PRN, Phillips Grout, MD .  TRIPLE ANTIBIOTIC [phone] OINT 1 application, 1 application, Apply externally, Once PRN, Jodi Marble, MD:  . lactulose  20 g Oral Once  . pantoprazole (PROTONIX) IV  40 mg Intravenous Q12H  . polyethylene  glycol  17 g Oral BID  . sodium chloride flush  3 mL Intravenous Q12H  :  No Known Allergies:  History reviewed. No pertinent family history.:  Social History   Socioeconomic History  . Marital status: Widowed    Spouse name: Not on file  . Number of children: Not on file  . Years of education: Not on file  . Highest education level: Not on file  Occupational History  . Not on file  Social Needs  . Financial resource strain: Not on file  . Food insecurity:    Worry: Not on file    Inability: Not on file  . Transportation needs:    Medical: Not on file    Non-medical: Not on file  Tobacco Use  . Smoking status: Current Every Day Smoker    Packs/day: 1.00    Years: 52.00    Pack years: 52.00    Types: Cigarettes  . Smokeless tobacco: Never Used  Substance and Sexual Activity  . Alcohol use: Yes    Frequency: Never    Comment: 12/16/2018 "might have a drink q couple months"  . Drug use: Not Currently    Types: Marijuana    Comment: 12/16/2018 "twice in my 20's"  . Sexual activity: Not Currently  Lifestyle  . Physical activity:    Days per week: Not on file    Minutes per session: Not on file  . Stress: Not on file  Relationships  . Social connections:    Talks on phone: Not on file    Gets together: Not on file    Attends religious service: Not on file    Active member of club or organization: Not on file    Attends meetings of clubs or organizations: Not on file    Relationship status: Not on file  . Intimate partner violence:    Fear of current or ex partner: Not on file    Emotionally abused: Not on file    Physically abused: Not on file    Forced sexual activity: Not on file  Other Topics Concern  . Not on file  Social History Narrative  . Not on file  :  Review of Systems  HENT: Positive for congestion and sore throat.   Eyes: Negative.   Respiratory: Negative.   Cardiovascular: Negative.   Gastrointestinal: Positive for melena.  Genitourinary:  Negative.   Musculoskeletal: Negative.   Skin: Negative.   Neurological: Negative.   Moreno/Heme/Allergies: Negative.   Psychiatric/Behavioral: Negative.      Exam: See above Patient Vitals for the past 24 hrs:  BP Temp Temp src Pulse Resp SpO2  12/16/18 1409 104/85 97.7 F (36.5 C) Oral 75 -  97 %  12/16/18 0439 133/82 98 F (36.7 C) Oral 75 (!) 22 92 %  12/15/18 2150 (!) 149/85 98.2 F (36.8 C) Oral 80 20 96 %  12/15/18 2030 134/79 - - 74 (!) 21 93 %  12/15/18 2000 122/86 - - 72 (!) 23 93 %  12/15/18 1933 122/75 - - 83 (!) 28 93 %  12/15/18 1930 - - - - - 96 %  12/15/18 1915 - - - 80 - 96 %  12/15/18 1757 - - - 77 (!) 22 98 %  12/15/18 1730 - - - 72 - 96 %  12/15/18 1700 - - - 77 - 95 %  12/15/18 1630 - - - 71 - 96 %     Recent Labs    12/15/18 1152 12/16/18 0733  WBC 12.9* 12.8*  HGB 15.2 15.2  HCT 45.0 44.2  PLT 346 329   Recent Labs    12/15/18 1152  NA 134*  K 3.6  CL 104  CO2 21*  GLUCOSE 129*  BUN 29*  CREATININE 1.14  CALCIUM 9.2    Blood smear review: None  Pathology: None    Assessment and Plan: Miguel Moreno is a 67 year old white male.  He had what appeared to be transient melena.  He had no drop in his hemoglobin.  The issue still remains as to how to treat the head neck cancer.  It looks like he has locally advanced disease.  I know that ENT will speak with the surgeons at [facility] to see if they feel that this is a situation that they can handle surgically.  Again, he was supposed to see the oncologist at [facility] tomorrow.  I agree with the need for a PET scan.  Again this cannot be done as an inpatient because of government rules.  If he is not a surgical candidate upfront, then I definitely would think that he would be a radiation/chemotherapy candidate.  I totally agree with ENT that this bleed is not from the tonsil.  I suspect is probably from him taking ibuprofen.  He was on Xarelto.  I will stop the Xarelto for right  now.  I will get a Doppler of his neck to see if that thrombus in the right jugular vein is improved.  We probably could hold on the Xarelto for right now.  He is never to take nonsteroidals.  I told him that.  I said he could not take Motrin, Aleve, ibuprofen or Naprosyn.  I will give him some Ultram for his discomfort.  For right now, there is not much that we need to do for him.  I would make sure that the biopsy that he had had in December is sent off for HPV evaluation.  Lattie Haw, MD  Numbers 24:17

## 2018-12-16 NOTE — Progress Notes (Signed)
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  He initially presented to [facility], however gastroenterology there felt like the neck cancer may be the source of bleed and was transferred to [facility]  Subjective: -He is hungry.  Denies any chest pain, denies any shortness of breath.  No abdominal pain, no nausea or vomiting.  He denies coughing up blood, denies difficulties with swallowing food or any throat issues  Assessment & Plan: Principal Problem:   GIB (gastrointestinal bleeding) Active Problems:   Neck mass   Blood clot in vein  Principal Problem Melena/concern for upper GI bleed -Patient with history of PUD, he has been taking NSAIDs for his pain in his neck as well as his multiple chronic joint pains. -Placed on Protonix, continue, gastroenterology was consulted, appreciate input -Hemoglobin actually normal, continue to monitor -Hold anticoagulation currently  Active Problems: Squamous cell carcinoma of the neck -ENT consulted, discussed with Dr. Erik Obey, there is no evidence of bleeding from tonsils or pharynx -He will need further work-up as an outpatient with PET scan as well as evaluation at [facility]  Right IJ vein thrombosis -Xarelto on hold  Tobacco use -Strongly counseled for cessation   Scheduled Meds: . pantoprazole (PROTONIX) IV  40 mg Intravenous Q12H  . sodium chloride flush  3 mL Intravenous Q12H   Continuous Infusions: . sodium chloride     PRN Meds:.sodium chloride, lidocaine, lidocaine, lidocaine-EPINEPHrine, oxymetazoline, silver nitrate applicators, sodium chloride  flush, TRIPLE ANTIBIOTIC  DVT prophylaxis: SCDs Code Status: Full code Family Communication: no family at bedside  Disposition Plan: home when ready   Consultants:   ENT  Gastroenterology  Procedures:   None  Antimicrobials:  None  Objective: Vitals:   12/15/18 2000 12/15/18 2030 12/15/18 2150 12/16/18 0439  BP: 122/86 134/79 (!) 149/85 133/82  Pulse: 72 74 80 75  Resp: (!) 23 (!) 21 20 (!) 22  Temp:   98.2 F (36.8 C) 98 F (36.7 C)  TempSrc:   Oral Oral  SpO2: 93% 93% 96% 92%  Weight:      Height:        Intake/Output Summary (Last 24 hours) at 12/16/2018 1308 Last data filed at 12/15/2018 1432 Gross per 24 hour  Intake 128.75 ml  Output -  Net 128.75 ml   Filed Weights   12/15/18 1014 12/15/18 1015  Weight: 74.8 kg 79.2 kg    Examination:  Constitutional: NAD Eyes: PERRL, lids and conjunctivae normal ENMT: Mucous membranes are moist. No oropharyngeal exudates Neck: Mass palpated on the right lateral aspect Respiratory: clear to auscultation bilaterally, faint end expiratory wheezing, no crackles. Normal respiratory effort. No accessory muscle use.  Cardiovascular: Regular rate and rhythm, no murmurs / rubs / gallops. No LE edema. 2+ pedal pulses. No carotid bruits.  Abdomen: no tenderness. Bowel sounds positive.  Musculoskeletal: no clubbing / cyanosis.  Skin: no rashes Neurologic: Nonfocal  Data Reviewed: I have independently reviewed following labs and imaging studies   CBC: Recent Labs  Lab 12/15/18 1152 12/16/18 0733  WBC 12.9* 12.8*  NEUTROABS 9.8*  --   HGB 15.2 15.2  HCT 45.0  44.2  MCV 89.8 88.8  PLT 346 154   Basic Metabolic Panel: Recent Labs  Lab 12/15/18 1152  NA 134*  K 3.6  CL 104  CO2 21*  GLUCOSE 129*  BUN 29*  CREATININE 1.14  CALCIUM 9.2   GFR: Estimated Creatinine Clearance: 59.3 mL/min (by C-G formula based on SCr of 1.14 mg/dL). Liver Function Tests: Recent Labs  Lab 12/15/18 1152  AST 17  ALT 14    ALKPHOS 59  BILITOT 0.5  PROT 7.8  ALBUMIN 4.2   Recent Labs  Lab 12/15/18 1152  LIPASE 31   No results for input(s): AMMONIA in the last 168 hours. Coagulation Profile: No results for input(s): INR, PROTIME in the last 168 hours. Cardiac Enzymes: No results for input(s): CKTOTAL, CKMB, CKMBINDEX, TROPONINI in the last 168 hours. BNP (last 3 results) No results for input(s): PROBNP in the last 8760 hours. HbA1C: No results for input(s): HGBA1C in the last 72 hours. CBG: No results for input(s): GLUCAP in the last 168 hours. Lipid Profile: No results for input(s): CHOL, HDL, LDLCALC, TRIG, CHOLHDL, LDLDIRECT in the last 72 hours. Thyroid Function Tests: No results for input(s): TSH, T4TOTAL, FREET4, T3FREE, THYROIDAB in the last 72 hours. Anemia Panel: No results for input(s): VITAMINB12, FOLATE, FERRITIN, TIBC, IRON, RETICCTPCT in the last 72 hours. Urine analysis:    Component Value Date/Time   COLORURINE YELLOW 12/15/2018 1251   APPEARANCEUR CLEAR 12/15/2018 1251   LABSPEC 1.011 12/15/2018 1251   PHURINE 6.0 12/15/2018 1251   GLUCOSEU NEGATIVE 12/15/2018 1251   HGBUR MODERATE (A) 12/15/2018 1251   BILIRUBINUR NEGATIVE 12/15/2018 1251   KETONESUR NEGATIVE 12/15/2018 1251   PROTEINUR NEGATIVE 12/15/2018 1251   NITRITE NEGATIVE 12/15/2018 1251   LEUKOCYTESUR NEGATIVE 12/15/2018 1251   Sepsis Labs: Invalid input(s): PROCALCITONIN, LACTICIDVEN  No results found for this or any previous visit (from the past 240 hour(s)).    Radiology Studies: No results found.   Marzetta Board, MD, PhD [facility] Pager [phone]  If 7PM-7AM, please contact night-coverage [email or web address] Password [email or web address] 12/16/2018, 1:08 PM

## 2018-12-16 NOTE — Consult Note (Signed)
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  1 ppd smoker x many yrs.  Recent onset RIGHT neck mass.  Underwent CT neck, CT chest.  Has necrotic appearing high bilateral neck nodes with compression and thrombosis RIGHT internal jugular vein. Possible RIGHT tonsil mass, submucosal.  No prior hx cancer.  Begun on Xarelto ~2 weeks ago.  Began having melanotic stool, but no hematemesis. No nasal or oral bleeding.   Uses NSAIDS.   FNA RIGHT neck mass per IR.  Pathology shows SCCa.  GI consult in [facility] felt bleeding likely from necrotic tonsil mass.  I do not know if they actually examined him.    PMH: Past Medical History:  Diagnosis Date  . Anxiety and depression   . Blood clot in vein   . Heart murmur     Surg Hx: Past Surgical History:  Procedure Laterality Date  . CATARACT EXTRACTION      FHx:  No family history on file. SocHx:  reports that he has been smoking cigarettes. He has a 20.00 pack-year smoking history. He has never used smokeless tobacco. He reports that he does not drink alcohol or use drugs.  ALLERGIES: No Known Allergies  Medications Prior to Admission  Medication Sig Dispense Refill  . ibuprofen (ADVIL,MOTRIN) 200 MG tablet Take 400 mg by mouth every 6 (six) hours as needed.    . Rivaroxaban 15 & 20 MG TBPK Take as directed on package: Start with one 15mg  tablet by mouth twice a day with food. On Day 22, switch to one 20mg  tablet once a day with food. 51 each 0  . Calcium Carbonate Antacid (MAALOX) 600 MG chewable tablet Chew 1 tablet (600 mg total) by mouth 3 (three) times daily as needed for heartburn. (Patient not taking: Reported on 12/15/2018) 30 tablet 0  . feeding supplement, ENSURE ENLIVE, (ENSURE ENLIVE) LIQD Take 237 mLs by mouth daily. (Patient not taking: Reported on 12/15/2018) 30 Bottle 0    Results for orders placed or performed during the hospital encounter of 12/15/18 (from the past 48  hour(s))  Type and screen [facility]     Status: None   Collection Time: 12/15/18 10:50 AM  Result Value Ref Range   ABO/RH(D) O POS    Antibody Screen NEG    Sample Expiration      12/18/2018 Performed at [facility], [address] [place], [place] [address]   Comprehensive metabolic panel     Status: Abnormal   Collection Time: 12/15/18 11:52 AM  Result Value Ref Range   Sodium 134 (L) 135 - 145 mmol/L   Potassium 3.6 3.5 - 5.1 mmol/L   Chloride 104 98 - 111 mmol/L   CO2 21 (L) 22 - 32 mmol/L   Glucose, Bld 129 (H) 70 - 99 mg/dL   BUN 29 (H) 8 - 23 mg/dL   Creatinine, Ser 1.14 0.61 - 1.24 mg/dL   Calcium 9.2 8.9 - 10.3 mg/dL   Total Protein 7.8 6.5 - 8.1 g/dL   Albumin 4.2 3.5 - 5.0 g/dL   AST 17 15 - 41 U/L   ALT 14 0 - 44 U/L   Alkaline Phosphatase 59 38 - 126 U/L   Total Bilirubin 0.5 0.3 - 1.2 mg/dL   GFR calc non Af Amer >60 >60 mL/min   GFR calc Af Amer >60 >60 mL/min   Anion gap 9 5 - 15  Comment: Performed at [facility], [address], [place], [place] [address]  CBC with Differential/Platelet     Status: Abnormal   Collection Time: 12/15/18 11:52 AM  Result Value Ref Range   WBC 12.9 (H) 4.0 - 10.5 K/uL   RBC 5.01 4.22 - 5.81 MIL/uL   Hemoglobin 15.2 13.0 - 17.0 g/dL   HCT 45.0 39.0 - 52.0 %   MCV 89.8 80.0 - 100.0 fL   MCH 30.3 26.0 - 34.0 pg   MCHC 33.8 30.0 - 36.0 g/dL   RDW 13.6 11.5 - 15.5 %   Platelets 346 150 - 400 K/uL   nRBC 0.0 0.0 - 0.2 %   Neutrophils Relative % 76 %   Neutro Abs 9.8 (H) 1.7 - 7.7 K/uL   Lymphocytes Relative 14 %   Lymphs Abs 1.9 0.7 - 4.0 K/uL   Monocytes Relative 6 %   Monocytes Absolute 0.8 0.1 - 1.0 K/uL   Eosinophils Relative 3 %   Eosinophils Absolute 0.4 0.0 - 0.5 K/uL   Basophils Relative 0 %   Basophils Absolute 0.1 0.0 - 0.1 K/uL   Immature Granulocytes 1 %   Abs Immature Granulocytes 0.09 (H) 0.00 - 0.07 K/uL    Comment: Performed at [facility], [address] [place], [place] [address]   Lipase, blood     Status: None   Collection Time: 12/15/18 11:52 AM  Result Value Ref Range   Lipase 31 11 - 51 U/L    Comment: Performed at [facility], [address], [place], [place] [address]  Urinalysis, Routine w reflex microscopic     Status: Abnormal   Collection Time: 12/15/18 12:51 PM  Result Value Ref Range   Color, Urine YELLOW YELLOW   APPearance CLEAR CLEAR   Specific Gravity, Urine 1.011 1.005 - 1.030   pH 6.0 5.0 - 8.0   Glucose, UA NEGATIVE NEGATIVE mg/dL   Hgb urine dipstick MODERATE (A) NEGATIVE   Bilirubin Urine NEGATIVE NEGATIVE   Ketones, ur NEGATIVE NEGATIVE mg/dL   Protein, ur NEGATIVE NEGATIVE mg/dL   Nitrite NEGATIVE NEGATIVE   Leukocytes, UA NEGATIVE NEGATIVE   RBC / HPF 0-5 0 - 5 RBC/hpf   WBC, UA 0-5 0 - 5 WBC/hpf   Bacteria, UA NONE SEEN NONE SEEN    Comment: Performed at [facility], [address] [place], [place] [address]  POC occult blood, ED Provider will collect     Status: Abnormal   Collection Time: 12/15/18  6:00 PM  Result Value Ref Range   Fecal Occult Bld POSITIVE (A) NEGATIVE  CBC     Status: Abnormal   Collection Time: 12/16/18  7:33 AM  Result Value Ref Range   WBC 12.8 (H) 4.0 - 10.5 K/uL   RBC 4.98 4.22 - 5.81 MIL/uL   Hemoglobin 15.2 13.0 - 17.0 g/dL   HCT 44.2 39.0 - 52.0 %   MCV 88.8 80.0 - 100.0 fL   MCH 30.5 26.0 - 34.0 pg   MCHC 34.4 30.0 - 36.0 g/dL   RDW 13.8 11.5 - 15.5 %   Platelets 329 150 - 400 K/uL   nRBC 0.0 0.0 - 0.2 %    Comment: Performed at [facility], [place] [address] [place], [place] [address]   No results found.  ROS:  chronic hoarseness.  No mouth, throat or neck pain.  No dysphagia or dyspnea.    Blood pressure 133/82, pulse 75, temperature 98 F (36.7 C), temperature source Oral,  resp. rate (!) 22, height 5\' 5"  (1.651 m), weight 79.2 kg, SpO2 92 %.  PHYSICAL EXAM: Overall appearance:  Gruff voice.  Breathing comfortably.  Non-distressed.   Head: NCAT Ears: clear Nose:  Left deviated  septum Oral Cavity: edentulous.   Oral Pharynx/Hypopharynx/Larynx:  OP clear.  RIGHT tonsil with some firmness but no visible lesion or necrosis. Chronic laryngitis changes with mobile vocal cords and good airway.  Some polypoid plaques on epiglottic petiole and in posterior commissure.    Neuro:  Grossly intact Neck:  RIGHT level II matted adenopathy.  LEFT level II small nodes.    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  No evidence of bleeding from tonsil or pharynx.  Probably PUD with aggravation from Xarelto.   Chronic laryngitis.  Plan:  Needs GI eval during this hospitalization.  Needs PET scan (this needs to be done as an outpatient for Medicare regulations).  Needs to quit smoking.  May be a candidate for TORS resection and neck dissections per Dr. Nicolette Bang at [facility].  I will confer with him to see if he wants to biopsy the RIGHT tonsil or wants [place] to proceed with panendoscopy and biopsies of tonsil and larynx.  Will need RT, possible chemo.    Ileene Hutchinson [facility] 16/09/9603, 54:09 PM

## 2018-12-16 NOTE — Consult Note (Addendum)
Referring Provider: [facility]   Primary Care Physician:  Patient, No Pcp Per Primary Gastroenterologist: unassinged Reason for Consultation:  GI bleed    ASSESSMENT / PLAN:    37. 67 yo male with recently diagnosed right sided head / neck cancer (mets suspected) . Right tonsillar submucosal mass / lymphadenopathy on neck CT scan. Lymph biopsy + squamous cell cancer. Has initial outpatient Oncology appt tomorrow.   2. R internal jugular vein thrombosis, on Eliquis at home. Last dose was night before last per patient   3. Black, heme + stool x 3 days on Eliquis. He also takes Ibuprofen. Reports hx of gastric ulcers but dates back to childhood.  Bun is elevated at 29, hgb completely stable in 15 range. Sounds like he has experienced some growling / nausea.  -GI at [facility] ED where patient presented yesterday, felt that melena was probably from neck necrotic mass. We will likely proceed with EGD at some point just to look for other etiologies, especially since he needs Eliquis.  -continue BID PPI -ENT to evaluate  3. 3.3 x 3.4 cm infrarenal abdominal aortic aneurysm.    HPI:      HPI: Miguel Moreno is a 67 y.o. male with recent diagnosis of a neck mass. CT scan of neck two weeks ago >>> 17 mm R palatine tonsillar submucosal mass suspecious for primary H&N cancer, cystic/ necrotic R>L neck lymphadenopathy c/w metastatic disease. Lymph node biopsy + for squamous cell. He is admitted with melena on Eliquis which was started a couple of weeks ago for acute thrombosis of the right internal jugular He has had some associated nausea without vomiting. He takes Ibuprofen. No dysphagia except when throat gets really dry which it has been lately. No significant weight loss. Patient originally presented to [facility] ED yesterday. ED spoke with Dr. Barney Drain (GI) who felt patient would be best served with transfer to [facility] where IR and ENT could be involved. Last black stool was yesterday.  Stools have been hard, small pieces. He has chronic constipation, never had a colonoscopy. No other GI complaints   Patient's hgb is 15.2 which is at baseline. BUN is elevated at 29.   Past Medical History:  Diagnosis Date  . Anxiety and depression   . Blood clot in vein   . Heart murmur     Past Surgical History:  Procedure Laterality Date  . CATARACT EXTRACTION      Prior to Admission medications   Medication Sig Start Date End Date Taking? Authorizing Provider  ibuprofen (ADVIL,MOTRIN) 200 MG tablet Take 400 mg by mouth every 6 (six) hours as needed.   Yes [provider]  Rivaroxaban 15 & 20 MG TBPK Take as directed on package: Start with one 15mg  tablet by mouth twice a day with food. On Day 22, switch to one 20mg  tablet once a day with food. 12/03/18  Yes Mikhail, Velta Addison, DO  Calcium Carbonate Antacid (MAALOX) 600 MG chewable tablet Chew 1 tablet (600 mg total) by mouth 3 (three) times daily as needed for heartburn. Patient not taking: Reported on 12/15/2018 12/03/18   Cristal Ford, DO  feeding supplement, ENSURE ENLIVE, (ENSURE ENLIVE) LIQD Take 237 mLs by mouth daily. Patient not taking: Reported on 12/15/2018 12/03/18   Cristal Ford, DO    Current Facility-Administered Medications  Medication Dose Route Frequency Provider Last Rate Last Dose  . 0.9 %  sodium chloride infusion  250 mL Intravenous PRN Phillips Grout, MD      .  lidocaine (XYLOCAINE) 2 % jelly 1 application  1 application Topical Once PRN Jodi Marble, MD      . lidocaine (XYLOCAINE) 4 % external solution 0-50 mL  0-50 mL Topical Once PRN Jodi Marble, MD      . lidocaine-EPINEPHrine (XYLOCAINE-EPINEPHrine) 1 %-1:200000 (PF) injection 0-30 mL  0-30 mL Intradermal Once PRN Jodi Marble, MD      . oxymetazoline (AFRIN) 0.05 % nasal spray 1 spray  1 spray Each Nare Once PRN Jodi Marble, MD      . pantoprazole (PROTONIX) injection 40 mg  40 mg Intravenous Q12H Derrill Kay A, MD   40 mg  at 12/16/18 1000  . silver nitrate applicators applicator 1 Stick  1 Stick Topical Once PRN Jodi Marble, MD      . sodium chloride flush (NS) 0.9 % injection 3 mL  3 mL Intravenous Q12H Derrill Kay A, MD   3 mL at 12/16/18 1001  . sodium chloride flush (NS) 0.9 % injection 3 mL  3 mL Intravenous PRN Phillips Grout, MD      . TRIPLE ANTIBIOTIC [phone] OINT 1 application  1 application Apply externally Once PRN Jodi Marble, MD        Allergies as of 12/15/2018  . (No Known Allergies)    No family history on file.  Social History   Socioeconomic History  . Marital status: Widowed    Spouse name: Not on file  . Number of children: Not on file  . Years of education: Not on file  . Highest education level: Not on file  Occupational History  . Not on file  Social Needs  . Financial resource strain: Not on file  . Food insecurity:    Worry: Not on file    Inability: Not on file  . Transportation needs:    Medical: Not on file    Non-medical: Not on file  Tobacco Use  . Smoking status: Current Every Day Smoker    Packs/day: 1.00    Years: 20.00    Pack years: 20.00    Types: Cigarettes  . Smokeless tobacco: Never Used  Substance and Sexual Activity  . Alcohol use: Never    Frequency: Never  . Drug use: Never  . Sexual activity: Not on file  Lifestyle  . Physical activity:    Days per week: Not on file    Minutes per session: Not on file  . Stress: Not on file  Relationships  . Social connections:    Talks on phone: Not on file    Gets together: Not on file    Attends religious service: Not on file    Active member of club or organization: Not on file    Attends meetings of clubs or organizations: Not on file    Relationship status: Not on file  . Intimate partner violence:    Fear of current or ex partner: Not on file    Emotionally abused: Not on file    Physically abused: Not on file    Forced sexual activity: Not on file  Other Topics Concern  .  Not on file  Social History Narrative  . Not on file    Review of Systems: All systems reviewed and negative except where noted in HPI.  Physical Exam: Vital signs in last 24 hours: Temp:  [97.6 F (36.4 C)-98.2 F (36.8 C)] 98 F (36.7 C) (12/30 0439) Pulse Rate:  [71-107] 75 (12/30 0439) Resp:  [14-28] 22 (12/30 0439)  BP: (94-155)/(73-86) 133/82 (12/30 0439) SpO2:  [91 %-98 %] 92 % (12/30 0439) Weight:  [74.8 kg-79.2 kg] 79.2 kg (12/29 1015) Last BM Date: 12/15/18 General:   Alert, well-developed,  White male in NAD Psych:  Pleasant, cooperative. Normal mood and affect. Eyes:  Pupils equal, sclera clear, no icterus.   Conjunctiva pink. Ears:  Normal auditory acuity. Nose:  No deformity, discharge,  or lesions. Lungs:  Clear throughout to auscultation.   No wheezes, crackles, or rhonchi.  Heart:  Regular rate and rhythm; no murmurs, no lower extremity edema Abdomen:  Soft, non-distended, nontender, BS active, no palp mass    Rectal:  Deferred  Msk:  Symmetrical without gross deformities. . Neurologic:  Alert and  oriented x4;  grossly normal neurologically. Skin:  Intact without significant lesions or rashes.   Intake/Output from previous day: 12/29 0701 - 12/30 0700 In: 128.8 [I.V.:128.8] Out: -  Intake/Output this shift: No intake/output data recorded.  Lab Results: Recent Labs    12/15/18 1152 12/16/18 0733  WBC 12.9* 12.8*  HGB 15.2 15.2  HCT 45.0 44.2  PLT 346 329   BMET Recent Labs    12/15/18 1152  NA 134*  K 3.6  CL 104  CO2 21*  GLUCOSE 129*  BUN 29*  CREATININE 1.14  CALCIUM 9.2   LFT Recent Labs    12/15/18 1152  PROT 7.8  ALBUMIN 4.2  AST 17  ALT 14  ALKPHOS 34  BILITOT 0.5     Tye Savoy, NP-C @  12/16/2018, 10:08 AM

## 2018-12-16 NOTE — Progress Notes (Signed)
Text paged [facility] floor coverage regarding the required changes per [place] staff for the order to check thrombosis on patient's right jugular vein from US soft tissue head & neck (non-thyroid) to VAS US soft tissue head & neck (non-thyroid).  Awaiting for order change.

## 2018-12-17 ENCOUNTER — Ambulatory Visit (HOSPITAL_COMMUNITY): Payer: Medicare HMO | Admitting: Hematology

## 2018-12-17 ENCOUNTER — Inpatient Hospital Stay (HOSPITAL_COMMUNITY): Payer: Medicare HMO

## 2018-12-17 ENCOUNTER — Encounter (HOSPITAL_COMMUNITY)
Admission: EM | Disposition: A | Discharge status: Home / Self Care | Payer: Self-pay | Source: Home / Self Care | Attending: Internal Medicine

## 2018-12-17 DIAGNOSIS — I8290 Acute embolism and thrombosis of unspecified vein: Secondary | ICD-10-CM

## 2018-12-17 LAB — BASIC METABOLIC PANEL
Anion gap: 10 (ref 5–15)
BUN: 13 mg/dL (ref 8–23)
CALCIUM: 9.1 mg/dL (ref 8.9–10.3)
CO2: 22 mmol/L (ref 22–32)
Chloride: 105 mmol/L (ref 98–111)
Creatinine, Ser: 0.96 mg/dL (ref 0.61–1.24)
GFR calc Af Amer: >60 mL/min (ref >60)
GFR calc non Af Amer: >60 mL/min (ref >60)
Glucose, Bld: 113 mg/dL — ABNORMAL HIGH (ref 70–99)
Potassium: 4.2 mmol/L (ref 3.5–5.1)
SODIUM: 137 mmol/L (ref 135–145)

## 2018-12-17 LAB — CBC
HCT: 40.5 % (ref 39.0–52.0)
Hemoglobin: 13.7 g/dL (ref 13.0–17.0)
MCH: 29.9 pg (ref 26.0–34.0)
MCHC: 33.8 g/dL (ref 30.0–36.0)
MCV: 88.4 fL (ref 80.0–100.0)
PLATELETS: 265 10*3/uL (ref 150–400)
RBC: 4.58 MIL/uL (ref 4.22–5.81)
RDW: 13.5 % (ref 11.5–15.5)
WBC: 12.8 10*3/uL — ABNORMAL HIGH (ref 4.0–10.5)
nRBC: 0 % (ref 0.0–0.2)

## 2018-12-17 SURGERY — ESOPHAGOGASTRODUODENOSCOPY (EGD) WITH PROPOFOL
Anesthesia: Monitor Anesthesia Care

## 2018-12-17 MED ORDER — TRAMADOL HCL 50 MG PO TABS: 50.0000 mg | ORAL_TABLET | Freq: Four times a day (QID) | ORAL | 0 refills | Status: AC | PRN

## 2018-12-17 MED ORDER — PANTOPRAZOLE SODIUM 40 MG PO TBEC: 40.0000 mg | DELAYED_RELEASE_TABLET | Freq: Every day | ORAL | 1 refills | Status: AC

## 2018-12-17 NOTE — Progress Notes (Signed)
Text paged Dr. Oletha Cruel regarding the required change order from US soft tissue head & neck (non-thyroid) to check thrombosis in the right jugular vein to [place] VAS and gave this RN the verbal order to do it.  [place] staff will be putting the order.

## 2018-12-17 NOTE — Progress Notes (Signed)
Patient was scheduled for EGD this am for evaluation of dark stools, please refer to yesterday's consult note. Patient refused procedure yesterday. I went by to see if he wanted to reconsider but he does not. No further dark stools. For diet advancement and sounds like probable discharge home today. Hgb down from 15.2 to 13.7 but again not evidence for bleeding at this time. Would recommend daily PPI since patient has been taking NSAIDS.

## 2018-12-17 NOTE — Progress Notes (Signed)
Vascular ultrasound  UE venous duplex       has been completed. Preliminary results can be found under CV proc through chart review. June Leap, BS, RDMS, RVT

## 2018-12-17 NOTE — Discharge Summary (Signed)
Physician Discharge Summary  Miguel Moreno JAS:[id number] DOB: [birth year] DOA: 12/15/2018  PCP: Patient, No Pcp Per  Admit date: 12/15/2018 Discharge date: 12/17/2018  Admitted From: home Disposition:  Home   Recommendations for Outpatient Follow-up:  1. Follow up with PCP in 1-2 weeks 2. Please obtain BMP/CBC in one week 3. Please follow up on the following pending results:  Home Health: none  Equipment/Devices: none  Discharge Condition: stable CODE STATUS: Full code Diet recommendation: regular  HPI: Per Dr. Demetrios Isaacs is a 67 y.o. male with medical history significant of recent left neck mass hospitalization at [facility] and had a biopsy of the area suspicious for metastatic cancer comes in with melanotic stools.  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  He has had very dark melanotic stools since yesterday.  He has been having a lot of nausea but no vomiting.  He has not had any bright red blood per rectum.  He denies occasional use of NSAIDs over-the-counter for pain.  He is grossly heme positive on rectal exam.  Dr. Oneida Alar here at [facility], GI was called who deferred patient to [facility] because of the neck mass.  Patient is being referred for admission for GI bleed.  Hospital Course:  Principal problem Melena/concern for upper GI bleed -Patient with history of PUD, he has been taking NSAIDs for his pain in his neck as well as his multiple chronic joint pains.  His bleed is likely from GI source rather than his neck cancer.  Gastroenterology was consulted and followed patient while hospitalized.  He was offered an EGD but patient refused twice.  His hemoglobin overall has remained stable and is in the 13 range on discharge, he had no further bleeding.  He was placed on Protonix twice daily and his Xarelto was placed on hold at oncology recommendations.  Active Problems: Squamous cell carcinoma of the neck -ENT consulted,  discussed with Dr. Erik Obey, there is no evidence of bleeding from tonsils or pharynx, he will need further work-up as an outpatient with PET scan as well as evaluation at [facility] Oncology, Dr. Marin Olp was consulted, he will continue to see the Miguel Moreno as an outpatient, per his recommendations but could probably hold on the Xarelto for right now Right IJ vein thrombosis -Xarelto on hold, ultrasound with age indeterminant thrombosis present Tobacco use -Strongly counseled for cessation   Discharge Diagnoses:  Principal Problem:   GIB (gastrointestinal bleeding) Active Problems:   Neck mass   Blood clot in vein     Discharge Instructions   Allergies as of 12/17/2018   No Known Allergies     Medication List    STOP taking these medications   ibuprofen 200 MG tablet Commonly known as:  ADVIL,MOTRIN   Rivaroxaban 15 & 20 MG Tbpk     TAKE these medications   Calcium Carbonate Antacid 600 MG chewable tablet Commonly known as:  MAALOX Chew 1 tablet (600 mg total) by mouth 3 (three) times daily as needed for heartburn.   feeding supplement (ENSURE ENLIVE) Liqd Take 237 mLs by mouth daily.   pantoprazole 40 MG tablet Commonly known as:  PROTONIX Take 1 tablet (40 mg total) by mouth daily.   traMADol 50 MG tablet Commonly known as:  ULTRAM Take 1 tablet (50 mg total) by mouth every 6 (six) hours as needed for moderate pain.      Follow-up Information    Burney Gauze  R, MD Follow up.   Specialty:  Oncology Contact information: [address] [place] [address] [phone]           Consultations:  Collagen  ENT  Procedures/Studies:  Ct Soft Tissue Neck W Contrast  Result Date: 11/29/2018 CLINICAL DATA:  RIGHT ear pain and RIGHT lymphadenopathy after cleaning ear with peroxide 3 weeks ago. EXAM: CT NECK WITH CONTRAST TECHNIQUE: Multidetector CT imaging of the neck was performed using the standard protocol following the bolus  administration of intravenous contrast. CONTRAST:  66mL ISOVUE-300 IOPAMIDOL (ISOVUE-300) INJECTION 61% COMPARISON:  None. FINDINGS: PHARYNX AND LARYNX: Isodense 17 mm nodule suspected RIGHT palatine tonsil (axial image 38, coronal image 49). Widely patent airway. SALIVARY GLANDS: Normal. THYROID: Subcentimeter calcification RIGHT thyroid lobe, thyroid is otherwise unremarkable. LYMPH NODES: RIGHT level 2 necrotic/cystic nodal conglomeration measuring to 3.4 x 3.9 cm, Dominant lymph node measuring 2.2 x 3.1 cm. LEFT level 2 B cystic 11 mm and LEFT level 3 12 mm lymph nodes. Additional smaller LEFT neck lymph nodes. VASCULAR: RIGHT internal jugular vein distension with central filling defect at level of mid neck associated with lymphadenopathy. Mild calcific atherosclerosis carotid bifurcations. LIMITED INTRACRANIAL: Normal. VISUALIZED ORBITS: LEFT ocular lens implant. MASTOIDS AND VISUALIZED PARANASAL SINUSES: Mild paranasal sinus mucosal thickening without air-fluid levels. SKELETON: Nonacute. Patient is edentulous. Mild RIGHT temporomandibular osteoarthrosis. Severe lower cervical spondylosis. Moderate cervical facet arthropathy. UPPER CHEST: Lung apices are clear. Centrilobular emphysema. No superior mediastinal lymphadenopathy. OTHER: None. IMPRESSION: 1. Cystic/necrotic RIGHT > LEFT neck lymphadenopathy consistent with metastatic disease. 2. 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 Recommend ENT consultation. 3. Acute RIGHT internal jugular vein thrombosis related to lymphadenopathy. 4. Acute findings discussed with and reconfirmed by Dr.DAVID YELVERTON on 11/29/2018 at 10:18 pm. Aortic Atherosclerosis (ICD10-I70.0). Electronically Signed   By: Elon Alas M.D.   On: 11/29/2018 22:18   Ct Chest W Contrast  Result Date: 12/03/2018 CLINICAL DATA:  Newly diagnosed head/neck cancer, for staging EXAM: CT CHEST, ABDOMEN, AND PELVIS WITH CONTRAST  TECHNIQUE: Multidetector CT imaging of the chest, abdomen and pelvis was performed following the standard protocol during bolus administration of intravenous contrast. CONTRAST:  171mL ISOVUE-300 IOPAMIDOL (ISOVUE-300) INJECTION 61% COMPARISON:  None. FINDINGS: CT CHEST FINDINGS Cardiovascular: The heart is normal in size. No pericardial effusion. No evidence of thoracic aortic aneurysm. Mild atherosclerotic calcifications of the aortic arch. Mild coronary atherosclerosis of the LAD. Mediastinum/Nodes: No suspicious mediastinal lymphadenopathy. 9 mm short axis subcarinal node (series 3/image 34), within normal limits. No suspicious hilar or axillary lymphadenopathy. 7 mm short axis left supraclavicular node (series 3/image 6), indeterminate. Visualized thyroid is unremarkable. Lungs/Pleura: Moderate centrilobular and paraseptal emphysematous changes, upper lobe predominant. No focal consolidation. No suspicious pulmonary nodules. No pleural effusion or pneumothorax. Musculoskeletal: Visualized osseous structures are within normal limits. CT ABDOMEN PELVIS FINDINGS Hepatobiliary: Scattered probable hepatic cysts measuring up to 8 mm in the left hepatic lobe (series 3/image 56). Gallbladder is unremarkable. No intrahepatic or extrahepatic ductal dilatation. Pancreas: Within normal limits. Spleen: Scattered calcified granulomata. Adrenals/Urinary Tract: Adrenal glands are within normal limits. Bilateral renal cysts, including a 6.6 cm bilobed right lower pole renal cyst with a single thin septation (series 3/image 81), benign (Bosniak II). No hydronephrosis. Bladder is within normal limits. Stomach/Bowel: Stomach is within normal limits. No evidence of bowel obstruction. Normal appendix (series 3/image 91). Mild left colonic diverticulosis, without evidence of diverticulitis. Vascular/Lymphatic: 3.3 x 3.4 cm infrarenal  abdominal aortic aneurysm (series 3/image 80). Atherosclerotic calcifications of the abdominal aorta  and branch vessels. No suspicious abdominopelvic lymphadenopathy. Reproductive: Prostate is unremarkable. Other: No abdominopelvic ascites. Musculoskeletal: Degenerative changes of the lumbar spine. 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 IMPRESSION: 7 mm short axis left supraclavicular node, indeterminate. Otherwise, no findings specific for metastatic disease in the chest, abdomen, or pelvis. 3.3 x 3.4 cm infrarenal abdominal aortic aneurysm. Given known malignancy, this is of questionable significance, and a dedicated follow-up recommendation has not been provided. Electronically Signed   By: Julian Hy M.D.   On: 12/03/2018 15:29   Ct Abdomen Pelvis W Contrast  Result Date: 12/03/2018 CLINICAL DATA:  Newly diagnosed head/neck cancer, for staging EXAM: CT CHEST, ABDOMEN, AND PELVIS WITH CONTRAST TECHNIQUE: Multidetector CT imaging of the chest, abdomen and pelvis was performed following the standard protocol during bolus administration of intravenous contrast. CONTRAST:  136mL ISOVUE-300 IOPAMIDOL (ISOVUE-300) INJECTION 61% COMPARISON:  None. FINDINGS: CT CHEST FINDINGS Cardiovascular: The heart is normal in size. No pericardial effusion. No evidence of thoracic aortic aneurysm. Mild atherosclerotic calcifications of the aortic arch. Mild coronary atherosclerosis of the LAD. Mediastinum/Nodes: No suspicious mediastinal lymphadenopathy. 9 mm short axis subcarinal node (series 3/image 34), within normal limits. No suspicious hilar or axillary lymphadenopathy. 7 mm short axis left supraclavicular node (series 3/image 6), indeterminate. Visualized thyroid is unremarkable. Lungs/Pleura: Moderate centrilobular and paraseptal emphysematous changes, upper lobe predominant. No focal consolidation. No suspicious pulmonary nodules. No pleural effusion or pneumothorax. Musculoskeletal: Visualized osseous structures are within normal limits. CT ABDOMEN PELVIS FINDINGS Hepatobiliary: Scattered probable hepatic cysts  measuring up to 8 mm in the left hepatic lobe (series 3/image 56). Gallbladder is unremarkable. No intrahepatic or extrahepatic ductal dilatation. Pancreas: Within normal limits. Spleen: Scattered calcified granulomata. Adrenals/Urinary Tract: Adrenal glands are within normal limits. Bilateral renal cysts, including a 6.6 cm bilobed right lower pole renal cyst with a single thin septation (series 3/image 81), benign (Bosniak II). No hydronephrosis. Bladder is within normal limits. Stomach/Bowel: Stomach is within normal limits. No evidence of bowel obstruction. Normal appendix (series 3/image 91). Mild left colonic diverticulosis, without evidence of diverticulitis. Vascular/Lymphatic: 3.3 x 3.4 cm infrarenal abdominal aortic aneurysm (series 3/image 80). Atherosclerotic calcifications of the abdominal aorta and branch vessels. No suspicious abdominopelvic lymphadenopathy. Reproductive: Prostate is unremarkable. Other: No abdominopelvic ascites. Musculoskeletal: Degenerative changes of the lumbar spine. 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 IMPRESSION: 7 mm short axis left supraclavicular node, indeterminate. Otherwise, no findings specific for metastatic disease in the chest, abdomen, or pelvis. 3.3 x 3.4 cm infrarenal abdominal aortic aneurysm. Given known malignancy, this is of questionable significance, and a dedicated follow-up recommendation has not been provided. Electronically Signed   By: Julian Hy M.D.   On: 12/03/2018 15:29   [place] Core Biopsy (lymph Nodes)  Result Date: 12/02/2018 INDICATION: Right neck adenopathy EXAM: ULTRASOUND GUIDED CORE BIOPSY OF RIGHT NECK LYMPH NODE MEDICATIONS: None. ANESTHESIA/SEDATION: Fentanyl 75 mcg IV; Versed 2 mg IV Moderate Sedation Time:  10 minutes The patient was continuously monitored during the procedure by the interventional radiology nurse under my direct supervision. PROCEDURE: The procedure, risks, benefits, and alternatives were explained to the patient.  Questions regarding the procedure were encouraged and answered. The patient understands and consents to the procedure. The right neck was prepped with ChloraPrep in a sterile fashion, and a sterile drape was applied covering the operative field. A sterile gown and sterile gloves were used for the procedure. Local anesthesia was provided with 1% Lidocaine. Under  sonographic guidance, 3 18 gauge core biopsies of the enlarged right neck lymph node were obtained and placed in saline. COMPLICATIONS: None immediate. FINDINGS: Images document needle placement in the right neck lymph node. IMPRESSION: Successful ultrasound-guided core biopsy of an enlarged right neck lymph node. Electronically Signed   By: Marybelle Killings M.D.   On: 12/02/2018 16:25   Vas [place] Upper Extremity Venous Duplex  Result Date: 12/17/2018 UPPER VENOUS STUDY  Other Indications: Follow up right IJ thrombus from 11/29/18. Risk Factors: Right neck lymph node squamous cell carcinoma. Performing Technologist: June Leap RDMS, RVT  Examination Guidelines: A complete evaluation includes B-mode imaging, spectral Doppler, color Doppler, and power Doppler as needed of all accessible portions of each vessel. Bilateral testing is considered an integral part of a complete examination. Limited examinations for reoccurring indications may be performed as noted.  Right Findings: +----------+------------+----------+---------+-----------+-----------------+ RIGHT     CompressiblePropertiesPhasicitySpontaneous     Summary      +----------+------------+----------+---------+-----------+-----------------+ IJV         Partial                Yes       Yes    Age Indeterminate +----------+------------+----------+---------+-----------+-----------------+ Subclavian    Full                 Yes       Yes                      +----------+------------+----------+---------+-----------+-----------------+ Axillary      Full                 Yes       Yes                       +----------+------------+----------+---------+-----------+-----------------+ Enlarged cervical lymph nodes visualized  Left Findings: +----------+------------+----------+---------+-----------+-------+ LEFT      CompressiblePropertiesPhasicitySpontaneousSummary +----------+------------+----------+---------+-----------+-------+ IJV           Full                 Yes       Yes            +----------+------------+----------+---------+-----------+-------+ Subclavian    Full                 Yes       Yes            +----------+------------+----------+---------+-----------+-------+ Axillary      Full                 Yes       Yes            +----------+------------+----------+---------+-----------+-------+ Enlarged cervical lymph nodes visualized  Summary:  Right: Findings consistent with age indeterminate deep vein thrombosis involving the right internal jugular vein.  Left: No evidence of deep vein thrombosis in the visualized veins of the upper extremity.  *See table(s) above for measurements and observations.    Preliminary      Subjective: - no chest pain, shortness of breath, no abdominal pain, nausea or vomiting.   Discharge Exam: Vitals:   12/16/18 2014 12/17/18 0618  BP: 127/79 122/71  Pulse: 77 72  Resp: 19 18  Temp: 97.6 F (36.4 C) 98 F (36.7 C)  SpO2: 97% 99%    General: Pt is alert, awake, not in acute distress Cardiovascular: RRR, S1/S2 +, no rubs, no gallops Respiratory: CTA bilaterally, no wheezing, no rhonchi Abdominal: Soft, NT, ND, bowel sounds +  Extremities: no edema, no cyanosis   The results of significant diagnostics from this hospitalization (including imaging, microbiology, ancillary and laboratory) are listed below for reference.     Microbiology: No results found for this or any previous visit (from the past 240 hour(s)).   Labs: BNP (last 3 results) No results for input(s): BNP in the last 8760 hours. Basic  Metabolic Panel: Recent Labs  Lab 12/15/18 1152 12/17/18 0628  NA 134* 137  K 3.6 4.2  CL 104 105  CO2 21* 22  GLUCOSE 129* 113*  BUN 29* 13  CREATININE 1.14 0.96  CALCIUM 9.2 9.1   Liver Function Tests: Recent Labs  Lab 12/15/18 1152  AST 17  ALT 14  ALKPHOS 59  BILITOT 0.5  PROT 7.8  ALBUMIN 4.2   Recent Labs  Lab 12/15/18 1152  LIPASE 31   No results for input(s): AMMONIA in the last 168 hours. CBC: Recent Labs  Lab 12/15/18 1152 12/16/18 0733 12/17/18 0628  WBC 12.9* 12.8* 12.8*  NEUTROABS 9.8*  --   --   HGB 15.2 15.2 13.7  HCT 45.0 44.2 40.5  MCV 89.8 88.8 88.4  PLT 346 329 265   Cardiac Enzymes: No results for input(s): CKTOTAL, CKMB, CKMBINDEX, TROPONINI in the last 168 hours. BNP: Invalid input(s): POCBNP CBG: No results for input(s): GLUCAP in the last 168 hours. D-Dimer No results for input(s): DDIMER in the last 72 hours. Hgb A1c No results for input(s): HGBA1C in the last 72 hours. Lipid Profile No results for input(s): CHOL, HDL, LDLCALC, TRIG, CHOLHDL, LDLDIRECT in the last 72 hours. Thyroid function studies No results for input(s): TSH, T4TOTAL, T3FREE, THYROIDAB in the last 72 hours.  Invalid input(s): FREET3 Anemia work up No results for input(s): VITAMINB12, FOLATE, FERRITIN, TIBC, IRON, RETICCTPCT in the last 72 hours. Urinalysis    Component Value Date/Time   COLORURINE YELLOW 12/15/2018 1251   APPEARANCEUR CLEAR 12/15/2018 1251   LABSPEC 1.011 12/15/2018 1251   PHURINE 6.0 12/15/2018 1251   GLUCOSEU NEGATIVE 12/15/2018 1251   HGBUR MODERATE (A) 12/15/2018 1251   BILIRUBINUR NEGATIVE 12/15/2018 1251   KETONESUR NEGATIVE 12/15/2018 1251   PROTEINUR NEGATIVE 12/15/2018 1251   NITRITE NEGATIVE 12/15/2018 1251   LEUKOCYTESUR NEGATIVE 12/15/2018 1251   Sepsis Labs Invalid input(s): PROCALCITONIN,  WBC,  LACTICIDVEN   Time coordinating discharge: 35 minutes  SIGNED:  Marzetta Board, MD  [facility] 12/17/2018, 1:37 PM Pager [phone]  If 7PM-7AM, please contact night-coverage [email or web address] Password [email or web address]

## 2018-12-17 NOTE — Discharge Instructions (Signed)
Follow with Dr. Marin Olp in 1 week  Please get a complete blood count and chemistry panel checked by your Primary MD at your next visit, and again as instructed by your Primary MD. Please get your medications reviewed and adjusted by your Primary MD.  Please request your Primary MD to go over all Hospital Tests and Procedure/Radiological results at the follow up, please get all Hospital records sent to your Prim MD by signing hospital release before you go home.  If you had Pneumonia of Lung problems at the Hospital: Please get a 2 view Chest X ray done in 6-8 weeks after hospital discharge or sooner if instructed by your Primary MD.  If you have Congestive Heart Failure: Please call your Cardiologist or Primary MD anytime you have any of the following symptoms:  1) 3 pound weight gain in 24 hours or 5 pounds in 1 week  2) shortness of breath, with or without a dry hacking cough  3) swelling in the hands, feet or stomach  4) if you have to sleep on extra pillows at night in order to breathe  Follow cardiac low salt diet and 1.5 lit/day fluid restriction.  If you have diabetes Accuchecks 4 times/day, Once in AM empty stomach and then before each meal. Log in all results and show them to your primary doctor at your next visit. If any glucose reading is under 80 or above 300 call your primary MD immediately.  If you have Seizure/Convulsions/Epilepsy: Please do not drive, operate heavy machinery, participate in activities at heights or participate in high speed sports until you have seen by Primary MD or a Neurologist and advised to do so again.  If you had Gastrointestinal Bleeding: Please ask your Primary MD to check a complete blood count within one week of discharge or at your next visit. Your endoscopic/colonoscopic biopsies that are pending at the time of discharge, will also need to followed by your Primary MD.  Get Medicines reviewed and adjusted. Please take all your medications  with you for your next visit with your Primary MD  Please request your Primary MD to go over all hospital tests and procedure/radiological results at the follow up, please ask your Primary MD to get all Hospital records sent to his/her office.  If you experience worsening of your admission symptoms, develop shortness of breath, life threatening emergency, suicidal or homicidal thoughts you must seek medical attention immediately by calling 911 or calling your MD immediately  if symptoms less severe.  You must read complete instructions/literature along with all the possible adverse reactions/side effects for all the Medicines you take and that have been prescribed to you. Take any new Medicines after you have completely understood and accpet all the possible adverse reactions/side effects.   Do not drive or operate heavy machinery when taking Pain medications.   Do not take more than prescribed Pain, Sleep and Anxiety Medications  Special Instructions: If you have smoked or chewed Tobacco  in the last 2 yrs please stop smoking, stop any regular Alcohol  and or any Recreational drug use.  Wear Seat belts while driving.  Please note You were cared for by a hospitalist during your hospital stay. If you have any questions about your discharge medications or the care you received while you were in the hospital after you are discharged, you can call the unit and asked to speak with the hospitalist on call if the hospitalist that took care of you is not available. Once you  are discharged, your primary care physician will handle any further medical issues. Please note that NO REFILLS for any discharge medications will be authorized once you are discharged, as it is imperative that you return to your primary care physician (or establish a relationship with a primary care physician if you do not have one) for your aftercare needs so that they can reassess your need for medications and monitor your lab  values.  You can reach the hospitalist office at phone [phone] or fax [phone]   If you do not have a primary care physician, you can call [phone] for a physician referral.  Activity: As tolerated with Full fall precautions use walker/cane & assistance as needed  Diet: regular  Disposition Home

## 2018-12-19 LAB — H. PYLORI ANTIBODY, IGG: H Pylori IgG: 8.27 Index Value — ABNORMAL HIGH (ref 0.00–0.79)

## 2018-12-27 ENCOUNTER — Inpatient Hospital Stay (HOSPITAL_COMMUNITY): Payer: Medicare HMO | Attending: Hematology | Admitting: Hematology

## 2018-12-27 ENCOUNTER — Other Ambulatory Visit: Payer: Self-pay

## 2018-12-27 ENCOUNTER — Encounter (HOSPITAL_COMMUNITY): Payer: Self-pay | Admitting: Hematology

## 2018-12-27 ENCOUNTER — Encounter (HOSPITAL_COMMUNITY): Payer: Self-pay | Admitting: Lab

## 2018-12-27 VITALS — BP 147/79 | HR 90 | Temp 97.8°F | Resp 20 | Ht 61.5 in | Wt 176.9 lb

## 2018-12-27 DIAGNOSIS — Z7982 Long term (current) use of aspirin: Secondary | ICD-10-CM

## 2018-12-27 DIAGNOSIS — F17210 Nicotine dependence, cigarettes, uncomplicated: Secondary | ICD-10-CM | POA: Diagnosis not present

## 2018-12-27 DIAGNOSIS — H9201 Otalgia, right ear: Secondary | ICD-10-CM | POA: Diagnosis not present

## 2018-12-27 DIAGNOSIS — C4442 Squamous cell carcinoma of skin of scalp and neck: Secondary | ICD-10-CM

## 2018-12-27 DIAGNOSIS — C099 Malignant neoplasm of tonsil, unspecified: Secondary | ICD-10-CM

## 2018-12-27 DIAGNOSIS — Z86718 Personal history of other venous thrombosis and embolism: Secondary | ICD-10-CM | POA: Insufficient documentation

## 2018-12-27 NOTE — Progress Notes (Unsigned)
Referral sent to Rad Onc in [place]. Records faxed on 1/10

## 2018-12-27 NOTE — Assessment & Plan Note (Addendum)
1.  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 -He presented to the ER on 11/29/2018, CT of the neck showed bilateral necrotic lymphadenopathy in the neck, right more than left, largest lymph node measuring 3.9 cm, with a 17 mm right palatine tonsil mass which is submucosal.  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  He was started on Xarelto. -He underwent needle biopsy of the right neck lymph node on 12/02/2018, consistent with squamous cell carcinoma, P 16+. - CT CAP on 12/03/2018 shows 7 mm short axis left supraclavicular lymph node, indeterminate.  Otherwise no metastatic disease.  3.3 x 3.4 cm infrarenal abdominal aortic aneurysm. - He presented to the emergency room on 12/15/2018 with melena.  Xarelto was discontinued after 11 days. - He does not report any bleeding at this time. - He lives at home with his sons.  He is a retired Sports coach.  He does have back problems and arthritis in his lower extremities.  He denies any tingling or numbness in the extremities.  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  Does not have any difficulty hearing. - He smoked 1 pack/day for the past 50 years.  No history of alcohol use. Family history significant for father with prostate cancer. -I have recommended doing a PET CT scan to better delineate the primary in the tonsil.  He has an appointment to see Dr. Nicolette Bang at [facility] on the 21st to see if he is a candidate for resection of the primary and bilateral neck dissection. - I will see him back after the PET CT scan to discuss the results and further plan.  If he is not a candidate for surgery, he will be recommended to have concurrent systemic chemotherapy with radiation therapy. -We will make a referral to [place] and in [place]. -We also discussed the need for port placement for chemotherapy  administration. -Based on his functional status, I do not believe he is a candidate for high-dose cisplatin.  I think he will be able to tolerate weekly cisplatin throughout the course of radiation.  2.  Right internal jugular vein thrombosis: -This was picked up on CT scan of the neck done on 11/29/2018.  He was started on Xarelto. -He presented to ER on 12/15/2018 with melena.  Xarelto was discontinued. - We will continue to hold Xarelto at this time.

## 2018-12-27 NOTE — Progress Notes (Signed)
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 male is here because of right neck mass. He started having right neck adenopathy and ear pain back in November of 2019. He reports it improved slightly and he didn't go to the doctor right away. The adenopathy and pain increased and became constant so he ended up in the ER here at [facility]. He was diagnosed with right neck mass and a blood clot in his neck at this time. He was started on blood thinner Xarelto for 11 days and ended back in the hospital for hematochezia. They stopped the blood thinner and discharged him on Asprin daily. He had his biopsy of the right neck lymph node at the time he was hospitalized. He has an appointment with the ENT doctor at [facility] on 1/21.   He has mild swallowing discomfort but denies it interfering with his eating. His appetite is very good. He has smoked cigarettes since the age of 53. He roughly smokes a 1/2 pack per day. He does report ringing in his ears since he was a child but denies any hearing loss. Denies any nausea, vomiting, or diarrhea. Denies any new pains. Had not noticed any recent bleeding such as epistaxis, hematuria or hematochezia since stopping the blood thinner. Denies recent chest pain on exertion, shortness of breath on minimal exertion, pre-syncopal episodes, or palpitations. Denies any numbness or tingling in hands or feet. Denies any recent fevers, infections, or recent hospitalizations. He denies any balance or dizziness issues.   His family history is unknown by him due to little contact with them. He lives with 2 of his sons and daughter in [employer]. 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 He no longer drives a car due to his cataracts. He has a good support system and  his daughter in law is here and willing to help with with travels to and from appointments.   MEDICAL HISTORY:  Past Medical History:  Diagnosis Date  . Anxiety and depression   . Arthritis    "all over; joints" (12/16/2018)  . Chronic lower back pain   . GERD (gastroesophageal reflux disease)    "constantly" (12/16/2018)  . Heart murmur   . 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 Melena 12/15/2018  . Pneumonia    "3 times when I was a younger kid" (12/16/2018)  . SCC (squamous cell carcinoma)     left neck; "recent bx" (12/16/2018)  . Sinus headache    "a few/wk" (12/16/2018)  . Tinnitus, bilateral    "constantly" (12/16/2018)    SURGICAL HISTORY: Past Surgical History:  Procedure Laterality Date  . CATARACT EXTRACTION W/ INTRAOCULAR LENS IMPLANT Left   . SKIN BIOPSY Left 2019   neck    SOCIAL HISTORY: Social History   Socioeconomic History  . Marital status: Widowed    Spouse name: Not on file  . Number of children: Not on file  . Years of education: Not on file  . Highest education level: Not on file  Occupational History  . Not on file  Social Needs  . Financial resource strain: Not on file  . Food insecurity:    Worry: Not on file    Inability: Not on file  . Transportation needs:  Medical: Not on file    Non-medical: Not on file  Tobacco Use  . Smoking status: Current Every Day Smoker    Packs/day: 1.00    Years: 52.00    Pack years: 52.00    Types: Cigarettes  . Smokeless tobacco: Never Used  Substance and Sexual Activity  . Alcohol use: Yes    Frequency: Never    Comment: 12/16/2018 "might have a drink q couple months"  . Drug use: Not Currently    Types: Marijuana    Comment: 12/16/2018 "twice in my 20's"  . Sexual activity: Not Currently  Lifestyle  . Physical activity:    Days per week: Not on file    Minutes per session: Not on file  . Stress: Not on file  Relationships  . Social connections:    Talks on  phone: Not on file    Gets together: Not on file    Attends religious service: Not on file    Active member of club or organization: Not on file    Attends meetings of clubs or organizations: Not on file    Relationship status: Not on file  . Intimate partner violence:    Fear of current or ex partner: Not on file    Emotionally abused: Not on file    Physically abused: Not on file    Forced sexual activity: Not on file  Other Topics Concern  . Not on file  Social History Narrative  . Not on file    FAMILY HISTORY: History reviewed. No pertinent family history.  ALLERGIES:  has No Known Allergies.  MEDICATIONS:  Current Outpatient Medications  Medication Sig Dispense Refill  . Aspirin-Acetaminophen-Caffeine (EXCEDRIN PO) Take by mouth.    . feeding supplement, ENSURE ENLIVE, (ENSURE ENLIVE) LIQD Take 237 mLs by mouth daily. 30 Bottle 0  . pantoprazole (PROTONIX) 40 MG tablet Take 1 tablet (40 mg total) by mouth daily. 30 tablet 1  . traMADol (ULTRAM) 50 MG tablet Take 1 tablet (50 mg total) by mouth every 6 (six) hours as needed for moderate pain. 20 tablet 0  . Calcium Carbonate Antacid (MAALOX) 600 MG chewable tablet Chew 1 tablet (600 mg total) by mouth 3 (three) times daily as needed for heartburn. (Patient not taking: Reported on 12/27/2018) 30 tablet 0   No current facility-administered medications for this visit.     REVIEW OF SYSTEMS:   Constitutional: Denies fevers, chills or abnormal night sweats Eyes: Denies blurriness of vision, double vision or watery eyes Ears, nose, mouth, throat, and face: Denies mucositis or sore throat Respiratory: Denies cough, dyspnea or wheezes Cardiovascular: Denies palpitation, chest discomfort or lower extremity swelling Gastrointestinal:  Denies nausea, heartburn or change in bowel habits Skin: Denies abnormal skin rashes Lymphatics: + lymphadenopathy right neck Neurological:Denies numbness, tingling or new  weaknesses Behavioral/Psych: Mood is stable, no new changes  All other systems were reviewed with the patient and are negative.  PHYSICAL EXAMINATION: ECOG PERFORMANCE STATUS: 1 - Symptomatic but completely ambulatory  Vitals:   12/27/18 1346  BP: (!) 147/79  Pulse: 90  Resp: 20  Temp: 97.8 F (36.6 C)  SpO2: 97%   Filed Weights   12/27/18 1346  Weight: 176 lb 14.4 oz (80.2 kg)    GENERAL:alert, no distress and comfortable SKIN: skin color, texture, turgor are normal, no rashes or significant lesions EYES: normal, conjunctiva are pink and non-injected, sclera clear OROPHARYNX: Right tonsil is slightly prominent.  No other oropharyngeal masses.  Tongue is normal.  NECK: supple, thyroid normal size, non-tender, without nodularity LYMPH:  +for right and left sided adenopathy with pain on palpation  LUNGS: clear to auscultation and percussion with normal breathing effort HEART: regular rate & rhythm and no murmurs and no lower extremity edema ABDOMEN:abdomen soft, non-tender and normal bowel sounds Musculoskeletal:no cyanosis of digits and no clubbing  PSYCH: alert & oriented x 3 with fluent speech NEURO: no focal motor/sensory deficits  LABORATORY DATA:  I have reviewed the data as listed Lab Results  Component Value Date   WBC 12.8 (H) 12/17/2018   HGB 13.7 12/17/2018   HCT 40.5 12/17/2018   MCV 88.4 12/17/2018   PLT 265 12/17/2018     Chemistry      Component Value Date/Time   NA 137 12/17/2018 0628   K 4.2 12/17/2018 0628   CL 105 12/17/2018 0628   CO2 22 12/17/2018 0628   BUN 13 12/17/2018 0628   CREATININE 0.96 12/17/2018 0628      Component Value Date/Time   CALCIUM 9.1 12/17/2018 0628   ALKPHOS 59 12/15/2018 1152   AST 17 12/15/2018 1152   ALT 14 12/15/2018 1152   BILITOT 0.5 12/15/2018 1152       RADIOGRAPHIC STUDIES: I have personally reviewed the radiological images as listed and agreed with the findings in the report. Ct Soft Tissue Neck W  Contrast  Result Date: 11/29/2018 CLINICAL DATA:  RIGHT ear pain and RIGHT lymphadenopathy after cleaning ear with peroxide 3 weeks ago. EXAM: CT NECK WITH CONTRAST TECHNIQUE: Multidetector CT imaging of the neck was performed using the standard protocol following the bolus administration of intravenous contrast. CONTRAST:  41mL ISOVUE-300 IOPAMIDOL (ISOVUE-300) INJECTION 61% COMPARISON:  None. FINDINGS: PHARYNX AND LARYNX: Isodense 17 mm nodule suspected RIGHT palatine tonsil (axial image 38, coronal image 49). Widely patent airway. SALIVARY GLANDS: Normal. THYROID: Subcentimeter calcification RIGHT thyroid lobe, thyroid is otherwise unremarkable. LYMPH NODES: RIGHT level 2 necrotic/cystic nodal conglomeration measuring to 3.4 x 3.9 cm, Dominant lymph node measuring 2.2 x 3.1 cm. LEFT level 2 B cystic 11 mm and LEFT level 3 12 mm lymph nodes. Additional smaller LEFT neck lymph nodes. VASCULAR: RIGHT internal jugular vein distension with central filling defect at level of mid neck associated with lymphadenopathy. Mild calcific atherosclerosis carotid bifurcations. LIMITED INTRACRANIAL: Normal. VISUALIZED ORBITS: LEFT ocular lens implant. MASTOIDS AND VISUALIZED PARANASAL SINUSES: Mild paranasal sinus mucosal thickening without air-fluid levels. SKELETON: Nonacute. Patient is edentulous. Mild RIGHT temporomandibular osteoarthrosis. Severe lower cervical spondylosis. Moderate cervical facet arthropathy. UPPER CHEST: Lung apices are clear. Centrilobular emphysema. No superior mediastinal lymphadenopathy. OTHER: None. IMPRESSION: 1. Cystic/necrotic RIGHT > LEFT neck lymphadenopathy consistent with metastatic disease. 2. 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 Recommend ENT consultation. 3. Acute RIGHT internal jugular vein thrombosis related to lymphadenopathy. 4. Acute findings discussed with and reconfirmed by Dr.DAVID YELVERTON on 11/29/2018 at 10:18 pm.  Aortic Atherosclerosis (ICD10-I70.0). Electronically Signed   By: Elon Alas M.D.   On: 11/29/2018 22:18   Ct Chest W Contrast  Result Date: 12/03/2018 CLINICAL DATA:  Newly diagnosed head/neck cancer, for staging EXAM: CT CHEST, ABDOMEN, AND PELVIS WITH CONTRAST TECHNIQUE: Multidetector CT imaging of the chest, abdomen and pelvis was performed following the standard protocol during bolus administration of intravenous contrast. CONTRAST:  129mL ISOVUE-300 IOPAMIDOL (ISOVUE-300) INJECTION 61% COMPARISON:  None. FINDINGS: CT CHEST FINDINGS Cardiovascular: The heart is normal in size. No pericardial effusion. No evidence of thoracic aortic aneurysm. Mild  atherosclerotic calcifications of the aortic arch. Mild coronary atherosclerosis of the LAD. Mediastinum/Nodes: No suspicious mediastinal lymphadenopathy. 9 mm short axis subcarinal node (series 3/image 34), within normal limits. No suspicious hilar or axillary lymphadenopathy. 7 mm short axis left supraclavicular node (series 3/image 6), indeterminate. Visualized thyroid is unremarkable. Lungs/Pleura: Moderate centrilobular and paraseptal emphysematous changes, upper lobe predominant. No focal consolidation. No suspicious pulmonary nodules. No pleural effusion or pneumothorax. Musculoskeletal: Visualized osseous structures are within normal limits. CT ABDOMEN PELVIS FINDINGS Hepatobiliary: Scattered probable hepatic cysts measuring up to 8 mm in the left hepatic lobe (series 3/image 56). Gallbladder is unremarkable. No intrahepatic or extrahepatic ductal dilatation. Pancreas: Within normal limits. Spleen: Scattered calcified granulomata. Adrenals/Urinary Tract: Adrenal glands are within normal limits. Bilateral renal cysts, including a 6.6 cm bilobed right lower pole renal cyst with a single thin septation (series 3/image 81), benign (Bosniak II). No hydronephrosis. Bladder is within normal limits. Stomach/Bowel: Stomach is within normal limits. No  evidence of bowel obstruction. Normal appendix (series 3/image 91). Mild left colonic diverticulosis, without evidence of diverticulitis. Vascular/Lymphatic: 3.3 x 3.4 cm infrarenal abdominal aortic aneurysm (series 3/image 80). Atherosclerotic calcifications of the abdominal aorta and branch vessels. No suspicious abdominopelvic lymphadenopathy. Reproductive: Prostate is unremarkable. Other: No abdominopelvic ascites. Musculoskeletal: Degenerative changes of the lumbar spine. 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 IMPRESSION: 7 mm short axis left supraclavicular node, indeterminate. Otherwise, no findings specific for metastatic disease in the chest, abdomen, or pelvis. 3.3 x 3.4 cm infrarenal abdominal aortic aneurysm. Given known malignancy, this is of questionable significance, and a dedicated follow-up recommendation has not been provided. Electronically Signed   By: Julian Hy M.D.   On: 12/03/2018 15:29   Ct Abdomen Pelvis W Contrast  Result Date: 12/03/2018 CLINICAL DATA:  Newly diagnosed head/neck cancer, for staging EXAM: CT CHEST, ABDOMEN, AND PELVIS WITH CONTRAST TECHNIQUE: Multidetector CT imaging of the chest, abdomen and pelvis was performed following the standard protocol during bolus administration of intravenous contrast. CONTRAST:  173mL ISOVUE-300 IOPAMIDOL (ISOVUE-300) INJECTION 61% COMPARISON:  None. FINDINGS: CT CHEST FINDINGS Cardiovascular: The heart is normal in size. No pericardial effusion. No evidence of thoracic aortic aneurysm. Mild atherosclerotic calcifications of the aortic arch. Mild coronary atherosclerosis of the LAD. Mediastinum/Nodes: No suspicious mediastinal lymphadenopathy. 9 mm short axis subcarinal node (series 3/image 34), within normal limits. No suspicious hilar or axillary lymphadenopathy. 7 mm short axis left supraclavicular node (series 3/image 6), indeterminate. Visualized thyroid is unremarkable. Lungs/Pleura: Moderate centrilobular and paraseptal  emphysematous changes, upper lobe predominant. No focal consolidation. No suspicious pulmonary nodules. No pleural effusion or pneumothorax. Musculoskeletal: Visualized osseous structures are within normal limits. CT ABDOMEN PELVIS FINDINGS Hepatobiliary: Scattered probable hepatic cysts measuring up to 8 mm in the left hepatic lobe (series 3/image 56). Gallbladder is unremarkable. No intrahepatic or extrahepatic ductal dilatation. Pancreas: Within normal limits. Spleen: Scattered calcified granulomata. Adrenals/Urinary Tract: Adrenal glands are within normal limits. Bilateral renal cysts, including a 6.6 cm bilobed right lower pole renal cyst with a single thin septation (series 3/image 81), benign (Bosniak II). No hydronephrosis. Bladder is within normal limits. Stomach/Bowel: Stomach is within normal limits. No evidence of bowel obstruction. Normal appendix (series 3/image 91). Mild left colonic diverticulosis, without evidence of diverticulitis. Vascular/Lymphatic: 3.3 x 3.4 cm infrarenal abdominal aortic aneurysm (series 3/image 80). Atherosclerotic calcifications of the abdominal aorta and branch vessels. No suspicious abdominopelvic lymphadenopathy. Reproductive: Prostate is unremarkable. Other: No abdominopelvic ascites. Musculoskeletal: Degenerative changes of the lumbar spine. 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 IMPRESSION:  7 mm short axis left supraclavicular node, indeterminate. Otherwise, no findings specific for metastatic disease in the chest, abdomen, or pelvis. 3.3 x 3.4 cm infrarenal abdominal aortic aneurysm. Given known malignancy, this is of questionable significance, and a dedicated follow-up recommendation has not been provided. Electronically Signed   By: Julian Hy M.D.   On: 12/03/2018 15:29   [place] Core Biopsy (lymph Nodes)  Result Date: 12/02/2018 INDICATION: Right neck adenopathy EXAM: ULTRASOUND GUIDED CORE BIOPSY OF RIGHT NECK LYMPH NODE MEDICATIONS: None. ANESTHESIA/SEDATION:  Fentanyl 75 mcg IV; Versed 2 mg IV Moderate Sedation Time:  10 minutes The patient was continuously monitored during the procedure by the interventional radiology nurse under my direct supervision. PROCEDURE: The procedure, risks, benefits, and alternatives were explained to the patient. Questions regarding the procedure were encouraged and answered. The patient understands and consents to the procedure. The right neck was prepped with ChloraPrep in a sterile fashion, and a sterile drape was applied covering the operative field. A sterile gown and sterile gloves were used for the procedure. Local anesthesia was provided with 1% Lidocaine. Under sonographic guidance, 3 18 gauge core biopsies of the enlarged right neck lymph node were obtained and placed in saline. COMPLICATIONS: None immediate. FINDINGS: Images document needle placement in the right neck lymph node. IMPRESSION: Successful ultrasound-guided core biopsy of an enlarged right neck lymph node. Electronically Signed   By: Marybelle Killings M.D.   On: 12/02/2018 16:25   Vas [place] Upper Extremity Venous Duplex  Result Date: 12/17/2018 UPPER VENOUS STUDY  Other Indications: Follow up right IJ thrombus from 11/29/18. Risk Factors: Right neck lymph node squamous cell carcinoma. Performing Technologist: June Leap RDMS, RVT  Examination Guidelines: A complete evaluation includes B-mode imaging, spectral Doppler, color Doppler, and power Doppler as needed of all accessible portions of each vessel. Bilateral testing is considered an integral part of a complete examination. Limited examinations for reoccurring indications may be performed as noted.  Right Findings: +----------+------------+----------+---------+-----------+-----------------+ RIGHT     CompressiblePropertiesPhasicitySpontaneous     Summary      +----------+------------+----------+---------+-----------+-----------------+ IJV         Partial                Yes       Yes    Age Indeterminate  +----------+------------+----------+---------+-----------+-----------------+ Subclavian    Full                 Yes       Yes                      +----------+------------+----------+---------+-----------+-----------------+ Axillary      Full                 Yes       Yes                      +----------+------------+----------+---------+-----------+-----------------+ Enlarged cervical lymph nodes visualized  Left Findings: +----------+------------+----------+---------+-----------+-------+ LEFT      CompressiblePropertiesPhasicitySpontaneousSummary +----------+------------+----------+---------+-----------+-------+ IJV           Full                 Yes       Yes            +----------+------------+----------+---------+-----------+-------+ Subclavian    Full                 Yes       Yes            +----------+------------+----------+---------+-----------+-------+  Axillary      Full                 Yes       Yes            +----------+------------+----------+---------+-----------+-------+ Enlarged cervical lymph nodes visualized  Summary:  Right: Findings consistent with age indeterminate deep vein thrombosis involving the right internal jugular vein.  Left: No evidence of deep vein thrombosis in the visualized veins of the upper extremity.  *See table(s) above for measurements and observations.  Diagnosing physician: Servando Snare MD Electronically signed by Servando Snare MD on 12/17/2018 at 5:09:30 PM.    Final     ASSESSMENT & PLAN:  Squamous cell carcinoma of neck 1.  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 -He presented to the ER on 11/29/2018, CT of the neck showed bilateral necrotic lymphadenopathy in the neck, right more than left, largest lymph node measuring 3.9 cm, with a 17 mm right palatine tonsil mass which is submucosal.  He was also found to have right  internal jugular thrombus.  He was started on Xarelto. -He underwent needle biopsy of the right neck lymph node on 12/02/2018, consistent with squamous cell carcinoma, P 16+. - CT CAP on 12/03/2018 shows 7 mm short axis left supraclavicular lymph node, indeterminate.  Otherwise no metastatic disease.  3.3 x 3.4 cm infrarenal abdominal aortic aneurysm. - He presented to the emergency room on 12/15/2018 with melena.  Xarelto was discontinued after 11 days. - He does not report any bleeding at this time. - He lives at home with his sons.  He is a retired Sports coach.  He does have back problems and arthritis in his lower extremities.  He denies any tingling or numbness in the extremities.  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  Does not have any difficulty hearing. - He smoked 1 pack/day for the past 50 years.  No history of alcohol use. Family history significant for father with prostate cancer. -I have recommended doing a PET CT scan to better delineate the primary in the tonsil.  He has an appointment to see Dr. Nicolette Bang at [facility] on the 21st to see if he is a candidate for resection of the primary and bilateral neck dissection. - I will see him back after the PET CT scan to discuss the results and further plan.  If he is not a candidate for surgery, he will be recommended to have concurrent systemic chemotherapy with radiation therapy. -We will make a referral to [place] and in [place]. -We also discussed the need for port placement for chemotherapy administration. -Based on his functional status, I do not believe he is a candidate for high-dose cisplatin.  I think he will be able to tolerate weekly cisplatin throughout the course of radiation.  2.  Right internal jugular vein thrombosis: -This was picked up on CT scan of the neck done on 11/29/2018.  He was started on Xarelto. -He presented to ER on 12/15/2018 with melena.  Xarelto was discontinued. - We will  continue to hold Xarelto at this time.  Orders Placed This Encounter  Procedures  . NM PET Image Initial (PI) Skull Base To Thigh    Standing Status:   Future    Standing Expiration Date:   12/27/2019    Order Specific Question:   ** REASON FOR EXAM (FREE  TEXT)    Answer:   squamous cell carcinoma of the neck    Order Specific Question:   If indicated for the ordered procedure, I authorize the administration of a radiopharmaceutical per Radiology protocol    Answer:   Yes    Order Specific Question:   Preferred imaging location?    Answer:   [facility]    Order Specific Question:   Radiology Contrast Protocol - do NOT remove file path    Answer:   [email or web address]  . Ambulatory referral to Social Work    Referral Priority:   Routine    Referral Type:   Consultation    Referral Reason:   Specialty Services Required    Number of Visits Requested:   1    All questions were answered. The patient knows to call the clinic with any problems, questions or concerns.      Derek Cleston, MD 12/27/2018 3:42 PM

## 2018-12-27 NOTE — Patient Instructions (Signed)
[facility] Discharge Instructions     Thank you for choosing [facility] at [facility] to provide your oncology and hematology care.  To afford each patient quality time with our provider, please arrive at least 15 minutes before your scheduled appointment time.   If you have a lab appointment with the Cancer Center please come in thru the  Main Entrance and check in at the main information desk  You need to re-schedule your appointment should you arrive 10 or more minutes late.  We strive to give you quality time with our providers, and arriving late affects you and other patients whose appointments are after yours.  Also, if you no show three or more times for appointments you may be dismissed from the clinic at the providers discretion.     Again, thank you for choosing [facility].  Our hope is that these requests will decrease the amount of time that you wait before being seen by our physicians.       _____________________________________________________________  Should you have questions after your visit to [facility], please contact our office at [phone] between the hours of 8:00 a.m. and 4:30 p.m.  Voicemails left after 4:00 p.m. will not be returned until the following business day.  For prescription refill requests, have your pharmacy contact our office and allow 72 hours.    Cancer Center Support Programs:   > Cancer Support Group  2nd Tuesday of the month 1pm-2pm, Journey Room    

## 2018-12-31 ENCOUNTER — Encounter (HOSPITAL_COMMUNITY): Payer: Self-pay | Admitting: General Practice

## 2018-12-31 NOTE — Progress Notes (Signed)
[facility] Psychosocial Distress Screening Clinical Social Work  Clinical Social Work was referred by distress screening protocol.  The patient scored a 6 on the Psychosocial Distress Thermometer which indicates moderate distress. Clinical Social Worker contacted patient by phone to assess for distress and other psychosocial needs. Called number on facesheet - was not identified as patient's phone so CSW did not leave message due to HIPAA concerns.  ONCBCN DISTRESS SCREENING 12/27/2018  Screening Type Initial Screening  Distress experienced in past week (1-10) 6  Emotional problem type Nervousness/Anxiety;Boredom  Information Concerns Type Lack of info about diagnosis;Lack of info about treatment  Physical Problem type Pain    Clinical Social Worker follow up needed: No.  If yes, follow up plan:  Beverely Pace, [place], LCSW Clinical Social Worker Phone:  [phone]

## 2019-01-02 ENCOUNTER — Ambulatory Visit: Payer: Medicare HMO | Admitting: General Surgery

## 2019-01-02 ENCOUNTER — Ambulatory Visit (INDEPENDENT_AMBULATORY_CARE_PROVIDER_SITE_OTHER): Payer: Medicare HMO | Admitting: Otolaryngology

## 2019-01-02 DIAGNOSIS — H903 Sensorineural hearing loss, bilateral: Secondary | ICD-10-CM | POA: Diagnosis not present

## 2019-01-02 DIAGNOSIS — H9313 Tinnitus, bilateral: Secondary | ICD-10-CM

## 2019-01-07 ENCOUNTER — Encounter (HOSPITAL_COMMUNITY): Payer: Medicare HMO | Attending: Nurse Practitioner

## 2019-01-08 ENCOUNTER — Telehealth (HOSPITAL_COMMUNITY): Payer: Self-pay | Admitting: Hematology

## 2019-01-08 NOTE — Telephone Encounter (Signed)
Patient did not do PET on 01/07/2019. Called to reschedule PET and follow up appointment with Dr. Delton Coombes.  Per Rodman Key, the patient did not want to do the scan at this time.  Stated he would reconsider in 3 months and call to reschedule.  Rodman Key also stated that the patient was not going to the consult appointment with Dr. Aviva Signs for chemo port placement (Jenkins's office notified).   Patient was audible in the background and confirmed Miguel Moreno's statements.  All appointments cancelled.  Patient was advise to call when ready to reschedule.

## 2019-01-09 ENCOUNTER — Ambulatory Visit (HOSPITAL_COMMUNITY): Payer: Medicare HMO | Admitting: Hematology

## 2019-01-10 ENCOUNTER — Encounter: Payer: Self-pay | Admitting: *Deleted

## 2019-01-13 ENCOUNTER — Ambulatory Visit: Payer: Medicare HMO | Admitting: Gastroenterology

## 2019-01-16 ENCOUNTER — Ambulatory Visit: Payer: Medicare HMO | Admitting: General Surgery

## 2019-02-16 ENCOUNTER — Emergency Department (HOSPITAL_COMMUNITY): Payer: Medicare HMO

## 2019-02-16 ENCOUNTER — Other Ambulatory Visit: Payer: Self-pay

## 2019-02-16 ENCOUNTER — Emergency Department (HOSPITAL_COMMUNITY)
Admission: EM | Admit: 2019-02-16 | Discharge: 2019-02-16 | Disposition: A | Discharge status: Home / Self Care | Payer: Medicare HMO | Attending: Emergency Medicine | Admitting: Emergency Medicine

## 2019-02-16 ENCOUNTER — Encounter (HOSPITAL_COMMUNITY): Payer: Self-pay | Admitting: Emergency Medicine

## 2019-02-16 DIAGNOSIS — Z79899 Other long term (current) drug therapy: Secondary | ICD-10-CM | POA: Insufficient documentation

## 2019-02-16 DIAGNOSIS — R110 Nausea: Secondary | ICD-10-CM | POA: Diagnosis not present

## 2019-02-16 DIAGNOSIS — R221 Localized swelling, mass and lump, neck: Secondary | ICD-10-CM | POA: Diagnosis not present

## 2019-02-16 DIAGNOSIS — R42 Dizziness and giddiness: Secondary | ICD-10-CM | POA: Insufficient documentation

## 2019-02-16 DIAGNOSIS — F17210 Nicotine dependence, cigarettes, uncomplicated: Secondary | ICD-10-CM | POA: Diagnosis not present

## 2019-02-16 DIAGNOSIS — R531 Weakness: Secondary | ICD-10-CM | POA: Diagnosis present

## 2019-02-16 LAB — URINALYSIS, ROUTINE W REFLEX MICROSCOPIC
Bilirubin Urine: NEGATIVE
Glucose, UA: NEGATIVE mg/dL
Hgb urine dipstick: NEGATIVE
Ketones, ur: NEGATIVE mg/dL
Leukocytes,Ua: NEGATIVE
Nitrite: NEGATIVE
Protein, ur: NEGATIVE mg/dL
Specific Gravity, Urine: 1.015 (ref 1.005–1.030)
pH: 6 (ref 5.0–8.0)

## 2019-02-16 LAB — BASIC METABOLIC PANEL WITH GFR
Anion gap: 9 (ref 5–15)
BUN: 25 mg/dL — ABNORMAL HIGH (ref 8–23)
CO2: 27 mmol/L (ref 22–32)
Calcium: 9.4 mg/dL (ref 8.9–10.3)
Chloride: 100 mmol/L (ref 98–111)
Creatinine, Ser: 0.98 mg/dL (ref 0.61–1.24)
GFR calc Af Amer: >60 mL/min (ref >60)
GFR calc non Af Amer: >60 mL/min (ref >60)
Glucose, Bld: 145 mg/dL — ABNORMAL HIGH (ref 70–99)
Potassium: 3.4 mmol/L — ABNORMAL LOW (ref 3.5–5.1)
Sodium: 136 mmol/L (ref 135–145)

## 2019-02-16 LAB — CBC
HCT: 39.8 % (ref 39.0–52.0)
Hemoglobin: 13 g/dL (ref 13.0–17.0)
MCH: 29 pg (ref 26.0–34.0)
MCHC: 32.7 g/dL (ref 30.0–36.0)
MCV: 88.8 fL (ref 80.0–100.0)
Platelets: 273 10*3/uL (ref 150–400)
RBC: 4.48 MIL/uL (ref 4.22–5.81)
RDW: 13.6 % (ref 11.5–15.5)
WBC: 13.3 10*3/uL — ABNORMAL HIGH (ref 4.0–10.5)
nRBC: 0 % (ref 0.0–0.2)

## 2019-02-16 LAB — ETHANOL: Alcohol, Ethyl (B): <10 mg/dL (ref <10)

## 2019-02-16 MED ORDER — IPRATROPIUM-ALBUTEROL 0.5-2.5 (3) MG/3ML IN SOLN
3.0000 mL | Freq: Once | RESPIRATORY_TRACT | Status: AC
  Administered 2019-02-16: 3 mL via RESPIRATORY_TRACT
  Filled 2019-02-16: qty 3

## 2019-02-16 MED ORDER — ONDANSETRON 8 MG PO TBDP: 8.0000 mg | ORAL_TABLET | Freq: Three times a day (TID) | ORAL | 0 refills | Status: AC | PRN

## 2019-02-16 MED ORDER — ONDANSETRON HCL 4 MG/2ML IJ SOLN
4.0000 mg | Freq: Once | INTRAMUSCULAR | Status: AC
  Administered 2019-02-16: 4 mg via INTRAVENOUS
  Filled 2019-02-16: qty 2

## 2019-02-16 MED ORDER — SODIUM CHLORIDE 0.9 % IV BOLUS (SEPSIS)
500.0000 mL | Freq: Once | INTRAVENOUS | Status: AC
  Administered 2019-02-16: 500 mL via INTRAVENOUS

## 2019-02-16 MED ORDER — SODIUM CHLORIDE 0.9 % IV SOLN
1000.0000 mL | INTRAVENOUS | Status: DC
  Administered 2019-02-16: 1000 mL via INTRAVENOUS

## 2019-02-16 NOTE — ED Provider Notes (Signed)
[facility] EMERGENCY DEPARTMENT Provider Note   CSN: [id number] Arrival date & time: 02/16/19  1930    History   Chief Complaint Chief Complaint  Patient presents with  . Weakness    HPI Miguel Moreno is a 68 y.o. male.     HPI Patient presents the ED for several complaints.  Patient states he has been feeling lightheaded today.  He has had a mild headache.  He complains of a cough and has had some nausea but no vomiting or diarrhea.  He denies any fevers or chills.  He denies any focal numbness or weakness.  He has been able to walk without difficulty.  Patient states he has had chronic swelling on the right side of his neck as well.  When I asked the patient if they have diagnosed him with anything he says no.  He states he did have a biopsy.  According to his medical records however the patient was seen on February 20 at [facility].  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  Patient has not followed up with surgeons as he was previously instructed.  He also has not started any radiation or chemo.  He does have scheduled appointment to follow up on that. Past Medical History:  Diagnosis Date  . Anxiety and depression   . Arthritis    "all over; joints" (12/16/2018)  . Chronic lower back pain   . GERD (gastroesophageal reflux disease)    "constantly" (12/16/2018)  . Heart murmur   . 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 Melena 12/15/2018  . Pneumonia    "3 times when I was a younger kid" (12/16/2018)  . PUD (peptic ulcer disease)   . SCC (squamous cell carcinoma)     left neck; "recent bx" (12/16/2018)  . Sinus headache    "a few/wk" (12/16/2018)  . Tinnitus, bilateral    "constantly" (12/16/2018)    Patient Active Problem List   Diagnosis Date Noted  . 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 GIB (gastrointestinal bleeding) 12/15/2018  . Blood clot in vein   . Melena   . Neck mass 11/30/2018  .  Tobacco abuse 11/30/2018  . Anxiety state 11/30/2018    Past Surgical History:  Procedure Laterality Date  . CATARACT EXTRACTION W/ INTRAOCULAR LENS IMPLANT Left   . SKIN BIOPSY Left 2019   neck        Home Medications    Prior to Admission medications   Medication Sig Start Date End Date Taking? Authorizing Provider  traMADol (ULTRAM) 50 MG tablet Take 1 tablet (50 mg total) by mouth every 6 (six) hours as needed for moderate pain. 12/17/18  Yes Gherghe, Vella Redhead, MD  ondansetron (ZOFRAN ODT) 8 MG disintegrating tablet Take 1 tablet (8 mg total) by mouth every 8 (eight) hours as needed for nausea or vomiting. 02/16/19   Dorie Rank, MD  pantoprazole (PROTONIX) 40 MG tablet Take 1 tablet (40 mg total) by mouth daily. Patient taking differently: Take 40 mg by mouth daily as needed (for GERD).  12/17/18 12/17/19  Caren Griffins, MD    Family History No family history on file.  Social History Social History   Tobacco Use  . Smoking status: Current Every Day Smoker    Packs/day: 1.00    Years: 52.00    Pack years: 52.00    Types: Cigarettes  . Smokeless tobacco: Never Used  Substance Use Topics  .  Alcohol use: Yes    Frequency: Never    Comment: 12/16/2018 "might have a drink q couple months"  . Drug use: Not Currently    Types: Marijuana    Comment: 12/16/2018 "twice in my 20's"     Allergies   Patient has no known allergies.   Review of Systems Review of Systems  All other systems reviewed and are negative.    Physical Exam Updated Vital Signs BP 120/66   Pulse 84   Temp (!) 97.5 F (36.4 C) (Oral)   Resp (!) 26   Ht 1.575 m (5\' 2" )   Wt 78.9 kg   SpO2 92%   BMI 31.83 kg/m   Physical Exam Vitals signs and nursing note reviewed.  Constitutional:      General: He is not in acute distress.    Appearance: He is well-developed.     Comments: Chronically ill-appearing  HENT:     Head: Normocephalic and atraumatic.     Right Ear: External ear  normal.     Left Ear: External ear normal.  Eyes:     General: No scleral icterus.       Right eye: No discharge.        Left eye: No discharge.     Conjunctiva/sclera: Conjunctivae normal.  Neck:     Musculoskeletal: Neck supple.     Trachea: No tracheal deviation.     Comments: Firm large right neck mass Cardiovascular:     Rate and Rhythm: Normal rate and regular rhythm.  Pulmonary:     Effort: Pulmonary effort is normal. No respiratory distress.     Breath sounds: No stridor. Wheezing present. No rales.  Abdominal:     General: Bowel sounds are normal. There is no distension.     Palpations: Abdomen is soft.     Tenderness: There is no abdominal tenderness. There is no guarding or rebound.  Musculoskeletal:        General: No swelling, tenderness or deformity.  Skin:    General: Skin is warm and dry.     Findings: No rash.  Neurological:     Mental Status: He is alert.     Cranial Nerves: Cranial nerve deficit: no gross deficits.     Sensory: No sensory deficit.     Motor: No abnormal muscle tone or seizure activity.     Coordination: Coordination normal.      ED Treatments / Results  Labs (all labs ordered are listed, but only abnormal results are displayed) Labs Reviewed  CBC - Abnormal; Notable for the following components:      Result Value   WBC 13.3 (*)    All other components within normal limits  BASIC METABOLIC PANEL - Abnormal; Notable for the following components:   Potassium 3.4 (*)    Glucose, Bld 145 (*)    BUN 25 (*)    All other components within normal limits  URINALYSIS, ROUTINE W REFLEX MICROSCOPIC - Abnormal; Notable for the following components:   APPearance HAZY (*)    All other components within normal limits  ETHANOL    EKG EKG Interpretation  Date/Time:  Sunday February 16 2019 19:44:07 EST Ventricular Rate:  91 PR Interval:    QRS Duration: 88 QT Interval:  378 QTC Calculation: 466 R Axis:   59 Text Interpretation:  Sinus  rhythm RSR' in V1 or V2, right VCD or RVH No significant change since last tracing Confirmed by Dorie Rank [phone]) on 02/16/2019 8:14:53 PM  Radiology Dg Chest 2 View  Result Date: 02/16/2019 CLINICAL DATA:  Weakness and near syncope EXAM: CHEST - 2 VIEW COMPARISON:  None. FINDINGS: The heart size and mediastinal contours are within normal limits. Both lungs are clear. The visualized skeletal structures are unremarkable. IMPRESSION: No active cardiopulmonary disease. Electronically Signed   By: Dorise Bullion III M.D   On: 02/16/2019 22:06   Ct Head Wo Contrast  Result Date: 02/16/2019 CLINICAL DATA:  68 y/o M; weakness and altered level of consciousness, unexplained. EXAM: CT HEAD WITHOUT CONTRAST TECHNIQUE: Contiguous axial images were obtained from the base of the skull through the vertex without intravenous contrast. COMPARISON:  None. FINDINGS: Brain: No evidence of acute infarction, hemorrhage, hydrocephalus, extra-axial collection or mass lesion/mass effect. Nonspecific confluent supratentorial white matter hypodensities are compatible with advanced chronic microvascular ischemic changes and there is moderate volume loss of the brain. Vascular: No hyperdense vessel or unexpected calcification. Skull: Normal. Negative for fracture or focal lesion. Sinuses/Orbits: No acute finding. Other: None. IMPRESSION: 1. No acute intracranial abnormality identified. 2. Advanced chronic microvascular ischemic changes and moderate volume loss of the brain. Electronically Signed   By: Kristine Garbe M.D.   On: 02/16/2019 21:59    Procedures Procedures (including critical care time)  Medications Ordered in ED Medications  sodium chloride 0.9 % bolus 500 mL (0 mLs Intravenous Stopped 02/16/19 2101)    Followed by  0.9 %  sodium chloride infusion (0 mLs Intravenous Stopped 02/16/19 2257)  ipratropium-albuterol (DUONEB) 0.5-2.5 (3) MG/3ML nebulizer solution 3 mL (3 mLs Nebulization Given 02/16/19 2034)    ondansetron (ZOFRAN) injection 4 mg (4 mg Intravenous Given 02/16/19 2222)     Initial Impression / Assessment and Plan / ED Course  I have reviewed the triage vital signs and the nursing notes.  Pertinent labs & imaging results that were available during my care of the patient were reviewed by me and considered in my medical decision making (see chart for details).  Clinical Course as of Feb 16 2303  Nancy Fetter Feb 16, 2019  2207 Notified that pt has vomited.  Will give dose of zofran   [JK]  2302 Patient observed spitting up.  Suspect some of this may be associated with his throat irritation and swelling   [JK]    Clinical Course User Index [JK] Dorie Rank, MD     Patient presented with several vague complaints.  Patient's ED work-up is reassuring.  He has a slight leukocytosis but this is similar to his baseline.  No signs of urinary tract infection.  Electrolyte panel is unremarkable.  Chest x-ray without pneumonia and CT scan without acute findings.  I suspect a lot of the patient's symptoms could be related to his poor p.o. intake associated with his throat cancer.  Patient was hydrated in the ED.  He was able to walk around without difficulty.  Focal neurologic deficits to suggest stroke or TIA.  Plan on discharge home with prescription for Zofran.  Follow-up with his oncologist.  Final Clinical Impressions(s) / ED Diagnoses   Final diagnoses:  Dizziness  Nausea  Neck mass    ED Discharge Orders         Ordered    ondansetron (ZOFRAN ODT) 8 MG disintegrating tablet  Every 8 hours PRN     02/16/19 2259           Dorie Rank, MD 02/16/19 2304

## 2019-02-16 NOTE — ED Notes (Signed)
Pt tolerated ambulation with steady gait and no complaints.

## 2019-02-16 NOTE — ED Triage Notes (Signed)
EMS called to pt's house for weakness. Pt states he thought he was going to pass out and wanted to be transported to hospital for evaluation.

## 2019-02-16 NOTE — Discharge Instructions (Addendum)
Try to drink plenty of fluids to stay hydrated, take the medications for nausea as needed, follow-up with your cancer doctors as planned

## 2019-02-17 ENCOUNTER — Emergency Department (HOSPITAL_COMMUNITY)
Admission: EM | Admit: 2019-02-17 | Discharge: 2019-02-17 | Disposition: A | Discharge status: Home / Self Care | Payer: Medicare HMO

## 2019-02-17 NOTE — ED Notes (Signed)
Pt arrived to er by [facility], was placed in lobby due to not having a bed, when RN went to triage pt registration advised that pt had just left after he advised that he was going to [facility], pt had previously left the er for same

## 2019-02-19 ENCOUNTER — Other Ambulatory Visit: Payer: Self-pay

## 2019-02-19 ENCOUNTER — Emergency Department (HOSPITAL_COMMUNITY): Payer: Medicare HMO

## 2019-02-19 ENCOUNTER — Encounter (HOSPITAL_COMMUNITY): Payer: Self-pay | Admitting: Emergency Medicine

## 2019-02-19 ENCOUNTER — Emergency Department (HOSPITAL_COMMUNITY)
Admission: EM | Admit: 2019-02-19 | Discharge: 2019-02-19 | Disposition: A | Discharge status: Home / Self Care | Payer: Medicare HMO | Attending: Emergency Medicine | Admitting: Emergency Medicine

## 2019-02-19 DIAGNOSIS — F17210 Nicotine dependence, cigarettes, uncomplicated: Secondary | ICD-10-CM | POA: Insufficient documentation

## 2019-02-19 DIAGNOSIS — R1084 Generalized abdominal pain: Secondary | ICD-10-CM | POA: Insufficient documentation

## 2019-02-19 DIAGNOSIS — R109 Unspecified abdominal pain: Secondary | ICD-10-CM | POA: Diagnosis present

## 2019-02-19 DIAGNOSIS — K5900 Constipation, unspecified: Secondary | ICD-10-CM | POA: Diagnosis not present

## 2019-02-19 DIAGNOSIS — C4442 Squamous cell carcinoma of skin of scalp and neck: Secondary | ICD-10-CM | POA: Insufficient documentation

## 2019-02-19 MED ORDER — FLEET ENEMA 7-19 GM/118ML RE ENEM
1.0000 | ENEMA | Freq: Once | RECTAL | Status: AC
  Administered 2019-02-19: 1 via RECTAL

## 2019-02-19 MED ORDER — SENNOSIDES-DOCUSATE SODIUM 8.6-50 MG PO TABS: 1.0000 | ORAL_TABLET | Freq: Every evening | ORAL | 0 refills | Status: AC | PRN

## 2019-02-19 MED ORDER — POLYETHYLENE GLYCOL 3350 17 G PO PACK: 17.0000 g | PACK | Freq: Every day | ORAL | 0 refills | Status: AC

## 2019-02-19 NOTE — ED Provider Notes (Signed)
Emergency Department Provider Note   I have reviewed the triage vital signs and the nursing notes.   HISTORY  Chief Complaint Abdominal Pain   HPI Miguel Moreno is a 68 y.o. male with PMH of GERD, PUD, and constipation presents to the emergency department for evaluation of constipation.  Patient describes diffuse abdominal discomfort worse in the lower abdomen.  He is having small, hard bowel movements but is unable to pass regular stool.  He reports trying to manually remove stool from his rectum but was unable to do so and believes he may have caused some bleeding.  He was not having bleeding before he tried to manually disimpact himself.  He denies any fevers or chills.  No UTI symptoms.  He has been taking laxative and stool softener with no relief.  He did not try enema at home.  Past Medical History:  Diagnosis Date  . Anxiety and depression   . Arthritis    "all over; joints" (12/16/2018)  . Chronic lower back pain   . GERD (gastroesophageal reflux disease)    "constantly" (12/16/2018)  . Heart murmur   . 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 Melena 12/15/2018  . Pneumonia    "3 times when I was a younger kid" (12/16/2018)  . PUD (peptic ulcer disease)   . SCC (squamous cell carcinoma)     left neck; "recent bx" (12/16/2018)  . Sinus headache    "a few/wk" (12/16/2018)  . Tinnitus, bilateral    "constantly" (12/16/2018)    Patient Active Problem List   Diagnosis Date Noted  . 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 GIB (gastrointestinal bleeding) 12/15/2018  . Blood clot in vein   . Melena   . Neck mass 11/30/2018  . Tobacco abuse 11/30/2018  . Anxiety state 11/30/2018    Past Surgical History:  Procedure Laterality Date  . CATARACT EXTRACTION W/ INTRAOCULAR LENS IMPLANT Left   . SKIN BIOPSY Left 2019   neck   Allergies Patient has no known allergies.  History reviewed. No pertinent family history.  Social  History Social History   Tobacco Use  . Smoking status: Current Every Day Smoker    Packs/day: 1.00    Years: 52.00    Pack years: 52.00    Types: Cigarettes  . Smokeless tobacco: Never Used  Substance Use Topics  . Alcohol use: Yes    Frequency: Never    Comment: 12/16/2018 "might have a drink q couple months"  . Drug use: Not Currently    Types: Marijuana    Comment: 12/16/2018 "twice in my 20's"    Review of Systems  Constitutional: No fever/chills Eyes: No visual changes. ENT: No sore throat. Cardiovascular: Denies chest pain. Respiratory: Denies shortness of breath. Gastrointestinal: Positive abdominal pain.  No nausea, no vomiting.  No diarrhea. Positive constipation. Genitourinary: Negative for dysuria. Musculoskeletal: Negative for back pain. Skin: Negative for rash. Neurological: Negative for headaches, focal weakness or numbness.  10-point ROS otherwise negative.  ____________________________________________   PHYSICAL EXAM:  VITAL SIGNS: ED Triage Vitals  Enc Vitals Group     BP 02/19/19 2043 (!) 159/85     Pulse Rate 02/19/19 2043 (!) 114     Resp 02/19/19 2043 18     Temp 02/19/19 2043 98.5 F (36.9 C)     Temp Source 02/19/19 2043 Oral     SpO2 02/19/19 2043 92 %     Weight 02/19/19 2046 174 lb (  78.9 kg)     Height 02/19/19 2046 5\' 2"  (1.575 m)     Pain Score 02/19/19 2046 3   Constitutional: Alert and oriented. Well appearing and in no acute distress. Eyes: Conjunctivae are normal.  Head: Atraumatic. Nose: No congestion/rhinnorhea. Mouth/Throat: Mucous membranes are moist.  Neck: No stridor.  Cardiovascular: Normal rate, regular rhythm. Good peripheral circulation. Grossly normal heart sounds.   Respiratory: Normal respiratory effort.  No retractions. Lungs CTAB. Gastrointestinal: Soft and nontender. Mild distention. Rectal exam with hard stool palpated at the finger tip but unable to pull forward and out. No fissure, hemorrhoid, or active  bleeding.  Musculoskeletal: No lower extremity tenderness nor edema. Skin:  Skin is warm, dry and intact. No rash noted.  ____________________________________________  [id number]  Dg Abdomen Acute W/chest  Result Date: 02/19/2019 CLINICAL DATA:  Abdominal pain and constipation for a week. EXAM: DG ABDOMEN ACUTE W/ 1V CHEST COMPARISON:  CXR 02/16/2019 FINDINGS: Top-normal heart size with aortic atherosclerosis. No aneurysm. Streaky bibasilar atelectasis is seen at each lung base. No pulmonary consolidation, effusion nor overt pulmonary edema. Moderate to marked stool retention within the colon consistent with constipation. No small bowel dilatation. Thoracolumbar spondylosis with multilevel degenerative disc disease is noted. No acute osseous is seen. No apparent genitourinary calculi. Pelvic phleboliths are present bilaterally. IMPRESSION: 1. Bibasilar atelectasis.  No active pulmonary disease. 2. Increased colonic stool burden consistent with constipation. Electronically Signed   By: Ashley Royalty M.D.   On: 02/19/2019 22:06    ____________________________________________   PROCEDURES  Procedure(s) performed:   Procedures  None ____________________________________________   INITIAL IMPRESSION / ASSESSMENT AND PLAN / ED COURSE  Pertinent labs & imaging results that were available during my care of the patient were reviewed by me and considered in my medical decision making (see chart for details).  Patient presents to the emergency department for evaluation of constipation.  I am able to palpate hard stool but it is at the very tip of my finger and I am unable to manually disimpact.  Plan for enema and reassess.  Will obtain abdominal plain film for further evaluation but do not feel the patient would benefit from lab work or advanced imaging of the abdomen.  Plain film reviewed.  No bowel obstruction or free air.  Patient feeling much better after enema.  He has had a large bowel  movement here in the emergency department.  I discussed taking MiraLAX daily for the next week and then as needed afterwards.  Also wrote for Peri-Colace.  ____________________________________________  FINAL CLINICAL IMPRESSION(S) / ED DIAGNOSES  Final diagnoses:  Generalized abdominal pain  Constipation, unspecified constipation type     MEDICATIONS GIVEN DURING THIS VISIT:  Medications  sodium phosphate (FLEET) 7-19 GM/118ML enema 1 enema (1 enema Rectal Given 02/19/19 2238)     NEW OUTPATIENT MEDICATIONS STARTED DURING THIS VISIT:  Discharge Medication List as of 02/19/2019 11:12 PM    START taking these medications   Details  polyethylene glycol (MIRALAX) packet Take 17 g by mouth daily for 14 days., Starting Wed 02/19/2019, Until Wed 03/05/2019, Print    senna-docusate (SENOKOT-S) 8.6-50 MG tablet Take 1 tablet by mouth at bedtime as needed for mild constipation or moderate constipation., Starting Wed 02/19/2019, Print        Note:  This document was prepared using Dragon voice recognition software and may include unintentional dictation errors.  Nanda Quinton, MD Emergency Medicine    Si Jachim, [facility], MD 02/19/19 [id number]

## 2019-02-19 NOTE — Discharge Instructions (Signed)
You were seen in the emergency department today for constipation.  We recommend that you use one or more of the following over-the-counter medications in the order described: °  °1)  Colace (or Dulcolax) 100 mg:  This is a stool softener, and you may take it once or twice a day as needed. °2)  Senna tablets:  This is a bowel stimulant that will help "push" out your stool. It is the next step to add after you have tried a stool softener. °3)  Miralax (powder):  This medication works by drawing additional fluid into your intestines and helps to flush out your stool.  Mix the powder with water or juice according to label instructions.  It may help if the Colace and Senna are not sufficient, but you must be sure to use the recommended amount of water or juice when you mix up the powder. °Remember that narcotic pain medications are constipating, so avoid them or minimize their use.  Drink plenty of fluids. ° °Please return to the Emergency Department immediately if you develop new or worsening symptoms that concern you, such as (but not limited to) fever > 101 degrees, severe abdominal pain, or persistent vomiting. ° ° °Constipation °Constipation is when a person has fewer than three bowel movements a week, has difficulty having a bowel movement, or has stools that are dry, hard, or larger than normal. As people grow older, constipation is more common. If you try to fix constipation with medicines that make you have a bowel movement (laxatives), the problem may get worse. Jasmarie Coppock-term laxative use may cause the muscles of the colon to become weak. A low-fiber diet, not taking in enough fluids, and taking certain medicines may make constipation worse.  °CAUSES  °Certain medicines, such as antidepressants, pain medicine, iron supplements, antacids, and water pills.   °Certain diseases, such as diabetes, irritable bowel syndrome (IBS), thyroid disease, or depression.   °Not drinking enough water.   °Not eating enough  fiber-rich foods.   °Stress or travel.   °Lack of physical activity or exercise.   °Ignoring the urge to have a bowel movement.   °Using laxatives too much.   °SIGNS AND SYMPTOMS  °Having fewer than three bowel movements a week.   °Straining to have a bowel movement.   °Having stools that are hard, dry, or larger than normal.   °Feeling full or bloated.   °Pain in the lower abdomen.   °Not feeling relief after having a bowel movement.   °DIAGNOSIS  °Your health care provider will take a medical history and perform a physical exam. Further testing may be done for severe constipation. Some tests may include: °A barium enema X-ray to examine your rectum, colon, and, sometimes, your small intestine.   °A sigmoidoscopy to examine your lower colon.   °A colonoscopy to examine your entire colon. °TREATMENT  °Treatment will depend on the severity of your constipation and what is causing it. Some dietary treatments include drinking more fluids and eating more fiber-rich foods. Lifestyle treatments may include regular exercise. If these diet and lifestyle recommendations do not help, your health care provider may recommend taking over-the-counter laxative medicines to help you have bowel movements. Prescription medicines may be prescribed if over-the-counter medicines do not work.  °HOME CARE INSTRUCTIONS  °Eat foods that have a lot of fiber, such as fruits, vegetables, whole grains, and beans. °Limit foods high in fat and processed sugars, such as french fries, hamburgers, cookies, candies, and soda.   °A   fiber supplement may be added to your diet if you cannot get enough fiber from foods.   °Drink enough fluids to keep your urine clear or pale yellow.   °Exercise regularly or as directed by your health care provider.   °Go to the restroom when you have the urge to go. Do not hold it.   °Only take over-the-counter or prescription medicines as directed by your health care provider. Do not take other medicines for constipation  without talking to your health care provider first.   °SEEK IMMEDIATE MEDICAL CARE IF:  °You have bright red blood in your stool.   °Your constipation lasts for more than 4 days or gets worse.   °You have abdominal or rectal pain.   °You have thin, pencil-like stools.   °You have unexplained weight loss. °MAKE SURE YOU:  °Understand these instructions. °Will watch your condition. °Will get help right away if you are not doing well or get worse. °Document Released: 09/01/2004 Document Revised: 12/09/2013 Document Reviewed: 09/15/2013 °[employer]® Patient Information ©2015 [employer]. This information is not intended to replace advice given to you by your health care provider. Make sure you discuss any questions you have with your health care provider. ° ° ° °

## 2019-02-19 NOTE — ED Notes (Signed)
Pt refused VS  

## 2019-02-19 NOTE — ED Triage Notes (Signed)
Pt states "ive been constipated for 1 week, his last bm was today but was hard and had to "dig" it out. " pt also states hes had a little bit of blood in his stool. Pt states he just need an enema. Per family pt has taken a stool softener but hasnt helped.

## 2019-03-18 ENCOUNTER — Other Ambulatory Visit: Payer: Self-pay

## 2019-03-18 ENCOUNTER — Inpatient Hospital Stay (HOSPITAL_COMMUNITY): Payer: Medicare HMO | Attending: Hematology | Admitting: Hematology

## 2019-03-18 ENCOUNTER — Encounter (HOSPITAL_COMMUNITY): Payer: Self-pay | Admitting: Hematology

## 2019-03-18 DIAGNOSIS — C4442 Squamous cell carcinoma of skin of scalp and neck: Secondary | ICD-10-CM

## 2019-03-18 NOTE — Progress Notes (Signed)
Virtual Visit via Telephone Note  I connected with Miguel Moreno on 03/18/19 at 10:30 AM EDT by telephone and verified that I am speaking with the correct person using two identifiers.   I discussed the limitations, risks, security and privacy concerns of performing an evaluation and management service by telephone and the availability of in person appointments. I also discussed with the patient that there may be a patient responsible charge related to this service. The patient expressed understanding and agreed to proceed.   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 - He was seen by me on December 27, 2018 and was lost to follow-up. -He was also evaluated by Dr.Yanagihara in [place].   Observations/Objective: I had a telephone conversation with the patient and his son.  Patient has not been able to eat well.  He is able to drink 1 or 2 ensures per day.  Even drinking Ensure sometimes is difficult.  He has not kept with his appointment with Dr. Nicolette Bang.  He also did not have a PET CT scan done.  Port was not placed.  He does report some sleep problems.  Cough and shortness of breath for last 1 month was reported.  He reports that his lymph nodes are further enlarged.  Assessment and Plan: -I had a prolonged discussion with the patient and his son about management of his head and neck cancer. - I have rescheduled him for a PET CT scan ASAP in [place]. - I have recommended that he have a PEG tube placement.  We discussed the pros and cons.  He will also have a port placed. -I will reschedule him back with Dr.Yanagihara. - I will see him back after the PET CT scan.   Follow Up Instructions: - PET CT scan in [place] at the next available slot. -PEG tube and port by Dr. Arnoldo Morale. -RTC after the PET scan.    I discussed the assessment and treatment plan with the patient. The patient was provided an opportunity to ask  questions and all were answered. The patient agreed with the plan and demonstrated an understanding of the instructions.   The patient was advised to call back or seek an in-person evaluation if the symptoms worsen or if the condition fails to improve as anticipated.  I provided 25 minutes of non-face-to-face time during this encounter.   Derek Mosie, MD

## 2019-03-18 NOTE — Progress Notes (Signed)
Called and spoke to patient and daughter in law via tele-visit process. Pt denies any fever. Pt complains of pain in the R jaw that is ongoing which he does have pain medication for. Pt complains of problems sleeping. The pt's daughter in law states, " I offered him Melatonin OTC but he won't take it".  The pt's daughter in law states pt has not ate much in a week and has difficulty eating anything not soft.

## 2019-04-14 ENCOUNTER — Encounter (HOSPITAL_COMMUNITY)
Admission: RE | Admit: 2019-04-14 | Discharge: 2019-04-14 | Disposition: A | Discharge status: Home / Self Care | Payer: Medicare HMO | Source: Ambulatory Visit | Attending: Nurse Practitioner | Admitting: Nurse Practitioner

## 2019-04-14 ENCOUNTER — Other Ambulatory Visit: Payer: Self-pay

## 2019-04-14 DIAGNOSIS — C4442 Squamous cell carcinoma of skin of scalp and neck: Secondary | ICD-10-CM

## 2019-04-14 MED ORDER — FLUDEOXYGLUCOSE F - 18 (FDG) INJECTION
10.4200 | Freq: Once | INTRAVENOUS | Status: AC | PRN
  Administered 2019-04-14: 17:00:00 10.42 via INTRAVENOUS

## 2019-04-18 ENCOUNTER — Inpatient Hospital Stay (HOSPITAL_COMMUNITY): Payer: Medicare HMO | Attending: Hematology | Admitting: Hematology

## 2019-04-18 ENCOUNTER — Encounter (HOSPITAL_COMMUNITY): Payer: Self-pay | Admitting: Hematology

## 2019-04-18 ENCOUNTER — Other Ambulatory Visit: Payer: Self-pay

## 2019-04-18 ENCOUNTER — Encounter (HOSPITAL_COMMUNITY): Payer: Self-pay | Admitting: *Deleted

## 2019-04-18 DIAGNOSIS — C4442 Squamous cell carcinoma of skin of scalp and neck: Secondary | ICD-10-CM | POA: Insufficient documentation

## 2019-04-18 DIAGNOSIS — R5383 Other fatigue: Secondary | ICD-10-CM | POA: Insufficient documentation

## 2019-04-18 DIAGNOSIS — Z79899 Other long term (current) drug therapy: Secondary | ICD-10-CM | POA: Diagnosis not present

## 2019-04-18 DIAGNOSIS — R1310 Dysphagia, unspecified: Secondary | ICD-10-CM | POA: Diagnosis not present

## 2019-04-18 DIAGNOSIS — Z86718 Personal history of other venous thrombosis and embolism: Secondary | ICD-10-CM | POA: Insufficient documentation

## 2019-04-18 DIAGNOSIS — K219 Gastro-esophageal reflux disease without esophagitis: Secondary | ICD-10-CM | POA: Diagnosis not present

## 2019-04-18 DIAGNOSIS — Z7901 Long term (current) use of anticoagulants: Secondary | ICD-10-CM | POA: Insufficient documentation

## 2019-04-18 DIAGNOSIS — F17210 Nicotine dependence, cigarettes, uncomplicated: Secondary | ICD-10-CM | POA: Insufficient documentation

## 2019-04-18 DIAGNOSIS — R51 Headache: Secondary | ICD-10-CM | POA: Insufficient documentation

## 2019-04-18 NOTE — Assessment & Plan Note (Signed)
1.  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 -He presented to the ER on 11/29/2018, CT of the neck showed bilateral necrotic lymphadenopathy in the neck, right more than left, largest lymph node measuring 3.9 cm, with a 17 mm right palatine tonsil mass which is submucosal.  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  He was started on Xarelto. -He underwent needle biopsy of the right neck lymph node on 12/02/2018, consistent with squamous cell carcinoma, P 16+. - CT CAP on 12/03/2018 shows 7 mm short axis left supraclavicular lymph node, indeterminate.  Otherwise no metastatic disease.  3.3 x 3.4 cm infrarenal abdominal aortic aneurysm. - Biopsy of the right neck lymph node on 12/02/2018 was consistent with squamous cell carcinoma.  This was P 16+. - He did not follow-up with Dr. Nicolette Bang at [facility]  He was evaluated by Dr.Yanagihara.  He also missed follow-ups for port placement.  He missed follow-ups after that.  I have evaluated him over the phone in March when he was losing weight. -He is accompanied by his daughter-in-law today. -We reviewed the PET CT scan results from 04/14/2019 which shows progression of the right tonsillar primary and adjacent nodal metastasis.  Element of bilateral pulmonary nodules, including a hypermetabolic right lower lobe nodule, highly suspicious for pulmonary metastatic disease.  New 11th medial left rib fracture with hypermetabolism which could be posttraumatic. - He is not able to eat any solid foods well.  He is also not getting enough calories.  He is not able to drink regular Ensure as it was thick.  I have recommended him to drink Ensure clear about 5 to 7 cans/day.  We talked about options including best supportive care in the form of hospice versus treatment.  Patient would like to try some treatments. -I have told him that he is not a candidate  for any aggressive chemotherapy.  I have recommended PD-L1 CPS testing on his biopsy.  If it is more than 1%, he will be a candidate for pembrolizumab.  If it is more than 20%, it is strongly recommended as the responses are better. -He is agreeable to this.  We also talked about feeding tube placement.  He was not interested in it. -We will see him back in 2 weeks for follow-up and discuss the results.   2.  Right IJ thrombosis: -This was found incidentally on CT of the neck on 11/29/2018.  He was started on Xarelto which was discontinued secondary to melena on 12/15/2018.

## 2019-04-18 NOTE — Patient Instructions (Signed)
[place] at [facility] Discharge Instructions  You were seen today by Dr. Delton Coombes. He went over your recent labs and scan results, it showed that  Your cancer has gotten worse. Your cancer is no longer curable. We can treat your cancer but we can't cure it. He does NOT feel like you are a good candidate for chemotherapy. He discussed immunotherapy with you. Once we have the results back from the test we will know if you will be elligable for any treatment. He would like you to try Ensure Clear to help with your nutrition. You need to be drinking 6-7 cans of ensure clear a day. You need to be getting at least 1,800 calories every day. He will see you back in 2 weeks for follow up.   Thank you for choosing [place] at [facility] to provide your oncology and hematology care.  To afford each patient quality time with our provider, please arrive at least 15 minutes before your scheduled appointment time.   If you have a lab appointment with the [facility] please come in thru the  Main Entrance and check in at the main information desk  You need to re-schedule your appointment should you arrive 10 or more minutes late.  We strive to give you quality time with our providers, and arriving late affects you and other patients whose appointments are after yours.  Also, if you no show three or more times for appointments you may be dismissed from the clinic at the providers discretion.     Again, thank you for choosing [facility].  Our hope is that these requests will decrease the amount of time that you wait before being seen by our physicians.       _____________________________________________________________  Should you have questions after your visit to [facility], please contact our office at [phone] between the hours of 8:00 a.m. and 4:30 p.m.  Voicemails left after 4:00 p.m. will not be returned until the  following business day.  For prescription refill requests, have your pharmacy contact our office and allow 72 hours.    Cancer Center Support Programs:   > Cancer Support Group  2nd Tuesday of the month 1pm-2pm, Journey Room

## 2019-04-18 NOTE — Progress Notes (Signed)
Miguel Moreno, [place] [address]   CLINIC:  Medical Oncology/Hematology  PCP:  Miguel Fire, MD Miguel Moreno [id number] [phone]   REASON FOR VISIT:  Follow-up for Squamous cell carcinoma of the right neck    INTERVAL HISTORY:  Miguel Moreno 68 y.o. male returns for routine follow-up. He is here today with his daughter- in- law. He states that he is unable to eat very much. He states that his throat is really dry and he has trouble swallowing. Denies any nausea, vomiting, or diarrhea. Denies any new pains. Had not noticed any recent bleeding such as epistaxis, hematuria or hematochezia. Denies recent chest pain on exertion, shortness of breath on minimal exertion, pre-syncopal episodes, or palpitations. Denies any numbness or tingling in hands or feet. Denies any recent fevers, infections, or recent hospitalizations. Patient reports appetite at 50% and energy level at 50%.    REVIEW OF SYSTEMS:  Review of Systems  Constitutional: Positive for chills and fatigue.  Respiratory: Positive for cough and shortness of breath.   Gastrointestinal: Positive for constipation.  Neurological: Positive for dizziness and headaches.     PAST MEDICAL/SURGICAL HISTORY:  Past Medical History:  Diagnosis Date  . Anxiety and depression   . Arthritis    "all over; joints" (12/16/2018)  . Chronic lower back pain   . GERD (gastroesophageal reflux disease)    "constantly" (12/16/2018)  . Heart murmur   . Internal jugular vein thrombosis (Miguel Moreno)    Miguel Moreno 12/16/2018  . Melena 12/15/2018  . Pneumonia    "3 times when I was a younger kid" (12/16/2018)  . PUD (peptic ulcer disease)   . SCC (squamous cell carcinoma)     left neck; "recent bx" (12/16/2018)  . Sinus headache    "a few/wk" (12/16/2018)  . Tinnitus, bilateral    "constantly" (12/16/2018)   Past Surgical History:  Procedure Laterality Date  . CATARACT EXTRACTION W/  INTRAOCULAR LENS IMPLANT Left   . SKIN BIOPSY Left 2019   neck     SOCIAL HISTORY:  Social History   Socioeconomic History  . Marital status: Widowed    Spouse name: Not on file  . Number of children: Not on file  . Years of education: Not on file  . Highest education level: Not on file  Occupational History  . Not on file  Social Needs  . Financial resource strain: Not on file  . Food insecurity:    Worry: Not on file    Inability: Not on file  . Transportation needs:    Medical: Not on file    Non-medical: Not on file  Tobacco Use  . Smoking status: Current Every Day Smoker    Packs/day: 1.00    Years: 52.00    Pack years: 52.00    Types: Cigarettes  . Smokeless tobacco: Never Used  Substance and Sexual Activity  . Alcohol use: Yes    Frequency: Never    Comment: 12/16/2018 "might have a drink q couple months"  . Drug use: Not Currently    Types: Marijuana    Comment: 12/16/2018 "twice in my 20's"  . Sexual activity: Not Currently  Lifestyle  . Physical activity:    Days per week: Not on file    Minutes per session: Not on file  . Stress: Not on file  Relationships  . Social connections:    Talks on phone: Not on file    Gets together: Not on  file    Attends religious service: Not on file    Active member of club or organization: Not on file    Attends meetings of clubs or organizations: Not on file    Relationship status: Not on file  . Intimate partner violence:    Fear of current or ex partner: Not on file    Emotionally abused: Not on file    Physically abused: Not on file    Forced sexual activity: Not on file  Other Topics Concern  . Not on file  Social History Narrative  . Not on file    FAMILY HISTORY:  History reviewed. No pertinent family history.  CURRENT MEDICATIONS:  Outpatient Encounter Medications as of 04/18/2019  Medication Sig  . ondansetron (ZOFRAN ODT) 8 MG disintegrating tablet Take 1 tablet (8 mg total) by mouth every 8  (eight) hours as needed for nausea or vomiting.  . pantoprazole (PROTONIX) 40 MG tablet Take 1 tablet (40 mg total) by mouth daily. (Patient taking differently: Take 40 mg by mouth daily as needed (for GERD). )  . senna-docusate (SENOKOT-S) 8.6-50 MG tablet Take 1 tablet by mouth at bedtime as needed for mild constipation or moderate constipation.  . traMADol (ULTRAM) 50 MG tablet Take 1 tablet (50 mg total) by mouth every 6 (six) hours as needed for moderate pain.   No facility-administered encounter medications on file as of 04/18/2019.     ALLERGIES:  No Known Allergies   PHYSICAL EXAM:  ECOG Performance status: 2  Vitals:   04/18/19 1132  BP: 118/64  Pulse: (!) 115  Resp: 20  Temp: 98.2 F (36.8 C)  SpO2: 96%   Filed Weights   04/18/19 1132  Weight: 136 lb 4.8 oz (61.8 kg)    Physical Exam Vitals signs reviewed.  Constitutional:      Appearance: Normal appearance.  Cardiovascular:     Rate and Rhythm: Normal rate and regular rhythm.     Heart sounds: Normal heart sounds.  Pulmonary:     Effort: Pulmonary effort is normal.     Breath sounds: Normal breath sounds.  Abdominal:     General: There is no distension.     Palpations: Abdomen is soft. There is no mass.  Musculoskeletal:        General: No swelling.  Lymphadenopathy:     Cervical: Cervical adenopathy present.  Skin:    General: Skin is warm.  Neurological:     General: No focal deficit present.     Mental Status: He is alert and oriented to person, place, and time.  Psychiatric:        Mood and Affect: Mood normal.        Behavior: Behavior normal.      LABORATORY DATA:  I have reviewed the labs as listed.  CBC    Component Value Date/Time   WBC 13.3 (H) 02/16/2019 2011   RBC 4.48 02/16/2019 2011   HGB 13.0 02/16/2019 2011   HCT 39.8 02/16/2019 2011   PLT 273 02/16/2019 2011   MCV 88.8 02/16/2019 2011   MCH 29.0 02/16/2019 2011   MCHC 32.7 02/16/2019 2011   RDW 13.6 02/16/2019 2011    LYMPHSABS 1.9 12/15/2018 1152   MONOABS 0.8 12/15/2018 1152   EOSABS 0.4 12/15/2018 1152   BASOSABS 0.1 12/15/2018 1152   CMP Latest Ref Rng & Units 02/16/2019 12/17/2018 12/15/2018  Glucose 70 - 99 mg/dL 145(H) 113(H) 129(H)  BUN 8 - 23 mg/dL 25(H) 13 29(H)  Creatinine 0.61 - 1.24 mg/dL 0.98 0.96 1.14  Sodium 135 - 145 mmol/L 136 137 134(L)  Potassium 3.5 - 5.1 mmol/L 3.4(L) 4.2 3.6  Chloride 98 - 111 mmol/L 100 105 104  CO2 22 - 32 mmol/L 27 22 21(L)  Calcium 8.9 - 10.3 mg/dL 9.4 9.1 9.2  Total Protein 6.5 - 8.1 g/dL - - 7.8  Total Bilirubin 0.3 - 1.2 mg/dL - - 0.5  Alkaline Phos 38 - 126 U/L - - 59  AST 15 - 41 U/L - - 17  ALT 0 - 44 U/L - - 14       DIAGNOSTIC IMAGING:  I have independently reviewed the scans and discussed with the patient.   I have reviewed Venita Lick LPN's note and agree with the documentation.  I personally performed a face-to-face visit, made revisions and my assessment and plan is as follows.    ASSESSMENT & PLAN:   Squamous cell carcinoma of neck 1.  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 -He presented to the ER on 11/29/2018, CT of the neck showed bilateral necrotic lymphadenopathy in the neck, right more than left, largest lymph node measuring 3.9 cm, with a 17 mm right palatine tonsil mass which is submucosal.  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  He was started on Xarelto. -He underwent needle biopsy of the right neck lymph node on 12/02/2018, consistent with squamous cell carcinoma, P 16+. - CT CAP on 12/03/2018 shows 7 mm short axis left supraclavicular lymph node, indeterminate.  Otherwise no metastatic disease.  3.3 x 3.4 cm infrarenal abdominal aortic aneurysm. - Biopsy of the right neck lymph node on 12/02/2018 was consistent with squamous cell carcinoma.  This was P 16+. - He did not follow-up with Dr. Nicolette Bang at  [facility].  He was evaluated by Dr.Yanagihara.  He also missed follow-ups for port placement.  He missed follow-ups after that.  I have evaluated him over the phone in March when he was losing weight. -He is accompanied by his daughter-in-law today. -We reviewed the PET CT scan results from 04/14/2019 which shows progression of the right tonsillar primary and adjacent nodal metastasis.  Element of bilateral pulmonary nodules, including a hypermetabolic right lower lobe nodule, highly suspicious for pulmonary metastatic disease.  New 11th medial left rib fracture with hypermetabolism which could be posttraumatic. - He is not able to eat any solid foods well.  He is also not getting enough calories.  He is not able to drink regular Ensure as it was thick.  I have recommended him to drink Ensure clear about 5 to 7 cans/day.  We talked about options including best supportive care in the form of hospice versus treatment.  Patient would like to try some treatments. -I have told him that he is not a candidate for any aggressive chemotherapy.  I have recommended PD-L1 CPS testing on his biopsy.  If it is more than 1%, he will be a candidate for pembrolizumab.  If it is more than 20%, it is strongly recommended as the responses are better. -He is agreeable to this.  We also talked about feeding tube placement.  He was not interested in it. -We will see him back in 2 weeks for follow-up and discuss the results.   2.  Right IJ thrombosis: -This was found incidentally on CT of the neck on 11/29/2018.  He  was started on Xarelto which was discontinued secondary to melena on 12/15/2018.    Total time spent is 40 minutes with more than 50% of the time spent face-to-face discussing scan results, management options, side effects and coordination of care.    Orders placed this encounter:  No orders of the defined types were placed in this encounter.     Derek Ronzell, MD [facility]  [phone]

## 2019-04-18 NOTE — Progress Notes (Signed)
I spoke with Suanne Marker in pathology and ordered PD-L1 CPS on accession # [id number].

## 2019-04-21 ENCOUNTER — Other Ambulatory Visit (HOSPITAL_COMMUNITY)
Admission: RE | Admit: 2019-04-21 | Discharge: 2019-04-21 | Disposition: A | Discharge status: Home / Self Care | Payer: Medicare HMO | Source: Ambulatory Visit | Attending: Hematology | Admitting: Hematology

## 2019-04-21 DIAGNOSIS — C4442 Squamous cell carcinoma of skin of scalp and neck: Secondary | ICD-10-CM | POA: Insufficient documentation

## 2019-05-06 ENCOUNTER — Encounter (HOSPITAL_COMMUNITY): Payer: Self-pay | Admitting: Dietician

## 2019-05-06 ENCOUNTER — Inpatient Hospital Stay (HOSPITAL_BASED_OUTPATIENT_CLINIC_OR_DEPARTMENT_OTHER): Payer: Medicare HMO | Admitting: Hematology

## 2019-05-06 ENCOUNTER — Other Ambulatory Visit: Payer: Self-pay

## 2019-05-06 ENCOUNTER — Encounter (HOSPITAL_COMMUNITY): Payer: Self-pay | Admitting: Hematology

## 2019-05-06 ENCOUNTER — Inpatient Hospital Stay (HOSPITAL_COMMUNITY): Payer: Medicare HMO

## 2019-05-06 VITALS — BP 92/53 | HR 107 | Temp 97.9°F | Resp 18 | Wt 126.8 lb

## 2019-05-06 DIAGNOSIS — Z7189 Other specified counseling: Secondary | ICD-10-CM | POA: Insufficient documentation

## 2019-05-06 DIAGNOSIS — K219 Gastro-esophageal reflux disease without esophagitis: Secondary | ICD-10-CM | POA: Diagnosis not present

## 2019-05-06 DIAGNOSIS — R1310 Dysphagia, unspecified: Secondary | ICD-10-CM

## 2019-05-06 DIAGNOSIS — R51 Headache: Secondary | ICD-10-CM | POA: Diagnosis not present

## 2019-05-06 DIAGNOSIS — C4442 Squamous cell carcinoma of skin of scalp and neck: Secondary | ICD-10-CM

## 2019-05-06 DIAGNOSIS — Z7901 Long term (current) use of anticoagulants: Secondary | ICD-10-CM

## 2019-05-06 DIAGNOSIS — R5383 Other fatigue: Secondary | ICD-10-CM

## 2019-05-06 DIAGNOSIS — Z86718 Personal history of other venous thrombosis and embolism: Secondary | ICD-10-CM

## 2019-05-06 DIAGNOSIS — Z79899 Other long term (current) drug therapy: Secondary | ICD-10-CM

## 2019-05-06 DIAGNOSIS — F17210 Nicotine dependence, cigarettes, uncomplicated: Secondary | ICD-10-CM

## 2019-05-06 LAB — COMPREHENSIVE METABOLIC PANEL
ALT: 11 U/L (ref 0–44)
AST: 14 U/L — ABNORMAL LOW (ref 15–41)
Albumin: 4 g/dL (ref 3.5–5.0)
Alkaline Phosphatase: 63 U/L (ref 38–126)
Anion gap: 16 — ABNORMAL HIGH (ref 5–15)
BUN: 38 mg/dL — ABNORMAL HIGH (ref 8–23)
CO2: 24 mmol/L (ref 22–32)
Calcium: 11.4 mg/dL — ABNORMAL HIGH (ref 8.9–10.3)
Chloride: 96 mmol/L — ABNORMAL LOW (ref 98–111)
Creatinine, Ser: 1.19 mg/dL (ref 0.61–1.24)
GFR calc Af Amer: >60 mL/min (ref >60)
GFR calc non Af Amer: >60 mL/min (ref >60)
Glucose, Bld: 123 mg/dL — ABNORMAL HIGH (ref 70–99)
Potassium: 3.6 mmol/L (ref 3.5–5.1)
Sodium: 136 mmol/L (ref 135–145)
Total Bilirubin: 1.1 mg/dL (ref 0.3–1.2)
Total Protein: 7.5 g/dL (ref 6.5–8.1)

## 2019-05-06 LAB — CBC WITH DIFFERENTIAL/PLATELET
Abs Immature Granulocytes: 0.06 10*3/uL (ref 0.00–0.07)
Basophils Absolute: 0 10*3/uL (ref 0.0–0.1)
Basophils Relative: 0 %
Eosinophils Absolute: 0.1 10*3/uL (ref 0.0–0.5)
Eosinophils Relative: 1 %
HCT: 47.9 % (ref 39.0–52.0)
Hemoglobin: 15.9 g/dL (ref 13.0–17.0)
Immature Granulocytes: 1 %
Lymphocytes Relative: 15 %
Lymphs Abs: 1.7 10*3/uL (ref 0.7–4.0)
MCH: 29.4 pg (ref 26.0–34.0)
MCHC: 33.2 g/dL (ref 30.0–36.0)
MCV: 88.5 fL (ref 80.0–100.0)
Monocytes Absolute: 0.8 10*3/uL (ref 0.1–1.0)
Monocytes Relative: 7 %
Neutro Abs: 8.7 10*3/uL — ABNORMAL HIGH (ref 1.7–7.7)
Neutrophils Relative %: 76 %
Platelets: 328 10*3/uL (ref 150–400)
RBC: 5.41 MIL/uL (ref 4.22–5.81)
RDW: 14.6 % (ref 11.5–15.5)
WBC: 11.3 10*3/uL — ABNORMAL HIGH (ref 4.0–10.5)
nRBC: 0 % (ref 0.0–0.2)

## 2019-05-06 NOTE — Progress Notes (Signed)
START ON PATHWAY REGIMEN - Head and Neck     A cycle is 21 days:     Pembrolizumab   **Always confirm dose/schedule in your pharmacy ordering system**  Patient Characteristics: Oropharynx, HPV Positive, Metastatic, First Line, No Prior Platinum-Based Chemoradiation within 6 Months, PD-L1 Expression Positive (CPS ? 1) Disease Classification: Oropharynx HPV Status: Positive (+) AJCC N Category: cN2 AJCC 8 Stage Grouping: IV Current Disease Status: Metastatic Disease AJCC T Category: T1 AJCC M Category: M1 Line of Therapy: First Line Prior Platinum Status: No Prior Platinum-Based Chemoradiation within 6 Months PD-L1 Expression Status: PD-L1 Positive (CPS ? 1) Intent of Therapy: Non-Curative / Palliative Intent, Discussed with Patient

## 2019-05-06 NOTE — Progress Notes (Signed)
[place], [place] [address]   CLINIC:  Medical Oncology/Hematology  PCP:  Rosita Fire, MD 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 Miguel Moreno 68 y.o. male returns for follow-up of his metastatic head and neck cancer.  He is accompanied by his daughter-in-law.  He is not able to eat solid foods.  He is eating soft foods like ice creams, oatmeal.  He could not find Ensure clear at the stores.  Denies any nausea or vomiting or diarrhea.  Reports that his right sided neck mass is increasing in size.  Denies any fevers or infections.  Appetite is 25% and energy levels are 50%.  He reports generalized body pains.    REVIEW OF SYSTEMS:  Review of Systems  Constitutional: Positive for fatigue. Negative for chills.  HENT:   Positive for trouble swallowing.   Respiratory: Positive for shortness of breath. Negative for cough.   Gastrointestinal: Negative for constipation.  Neurological: Positive for dizziness and headaches.  Psychiatric/Behavioral: Positive for sleep disturbance.     PAST MEDICAL/SURGICAL HISTORY:  Past Medical History:  Diagnosis Date  . Anxiety and depression   . Arthritis    "all over; joints" (12/16/2018)  . Chronic lower back pain   . GERD (gastroesophageal reflux disease)    "constantly" (12/16/2018)  . Heart murmur   . 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 Melena 12/15/2018  . Pneumonia    "3 times when I was a younger kid" (12/16/2018)  . PUD (peptic ulcer disease)   . SCC (squamous cell carcinoma)     left neck; "recent bx" (12/16/2018)  . Sinus headache    "a few/wk" (12/16/2018)  . Tinnitus, bilateral    "constantly" (12/16/2018)   Past Surgical History:  Procedure Laterality Date  . CATARACT EXTRACTION W/ INTRAOCULAR LENS IMPLANT Left   . SKIN BIOPSY Left 2019    neck     SOCIAL HISTORY:  Social History   Socioeconomic History  . Marital status: Widowed    Spouse name: Not on file  . Number of children: Not on file  . Years of education: Not on file  . Highest education level: Not on file  Occupational History  . Not on file  Social Needs  . Financial resource strain: Not on file  . Food insecurity:    Worry: Not on file    Inability: Not on file  . Transportation needs:    Medical: Not on file    Non-medical: Not on file  Tobacco Use  . Smoking status: Current Every Day Smoker    Packs/day: 1.00    Years: 52.00    Pack years: 52.00    Types: Cigarettes  . Smokeless tobacco: Never Used  Substance and Sexual Activity  . Alcohol use: Yes    Frequency: Never    Comment: 12/16/2018 "might have a drink q couple months"  . Drug use: Not Currently    Types: Marijuana    Comment: 12/16/2018 "twice in my 20's"  . Sexual activity: Not Currently  Lifestyle  . Physical activity:    Days per week: Not on file    Minutes per session: Not on file  . Stress: Not on file  Relationships  . Social connections:    Talks on phone: Not on file    Gets  together: Not on file    Attends religious service: Not on file    Active member of club or organization: Not on file    Attends meetings of clubs or organizations: Not on file    Relationship status: Not on file  . Intimate partner violence:    Fear of current or ex partner: Not on file    Emotionally abused: Not on file    Physically abused: Not on file    Forced sexual activity: Not on file  Other Topics Concern  . Not on file  Social History Narrative  . Not on file    FAMILY HISTORY:  No family history on file.  CURRENT MEDICATIONS:  Outpatient Encounter Medications as of 05/06/2019  Medication Sig  . acetaminophen (TYLENOL) 500 MG tablet Take 500 mg by mouth every 6 (six) hours as needed for moderate pain.  Marland Kitchen ondansetron (ZOFRAN ODT) 8 MG disintegrating tablet Take 1 tablet  (8 mg total) by mouth every 8 (eight) hours as needed for nausea or vomiting.  . pantoprazole (PROTONIX) 40 MG tablet Take 1 tablet (40 mg total) by mouth daily. (Patient taking differently: Take 40 mg by mouth daily as needed (for GERD). )  . senna-docusate (SENOKOT-S) 8.6-50 MG tablet Take 1 tablet by mouth at bedtime as needed for mild constipation or moderate constipation.  . traMADol (ULTRAM) 50 MG tablet Take 1 tablet (50 mg total) by mouth every 6 (six) hours as needed for moderate pain. (Patient not taking: Reported on 05/06/2019)   No facility-administered encounter medications on file as of 05/06/2019.     ALLERGIES:  No Known Allergies   PHYSICAL EXAM:  ECOG Performance status: 2  Vitals:   05/06/19 1203  BP: (!) 92/53  Pulse: (!) 107  Resp: 18  Temp: 97.9 F (36.6 C)  SpO2: 97%   Filed Weights   05/06/19 1203  Weight: 126 lb 12.8 oz (57.5 kg)    Physical Exam Vitals signs reviewed.  Constitutional:      Appearance: Normal appearance.  Cardiovascular:     Rate and Rhythm: Normal rate and regular rhythm.     Heart sounds: Normal heart sounds.  Pulmonary:     Effort: Pulmonary effort is normal.     Breath sounds: Normal breath sounds.  Abdominal:     General: There is no distension.     Palpations: Abdomen is soft. There is no mass.  Musculoskeletal:        General: No swelling.  Lymphadenopathy:     Cervical: Cervical adenopathy present.  Skin:    General: Skin is warm.  Neurological:     General: No focal deficit present.     Mental Status: He is alert and oriented to person, place, and time.  Psychiatric:        Mood and Affect: Mood normal.        Behavior: Behavior normal.      LABORATORY DATA:  I have reviewed the labs as listed.  CBC    Component Value Date/Time   WBC 13.3 (H) 02/16/2019 2011   RBC 4.48 02/16/2019 2011   HGB 13.0 02/16/2019 2011   HCT 39.8 02/16/2019 2011   PLT 273 02/16/2019 2011   MCV 88.8 02/16/2019 2011   MCH 29.0  02/16/2019 2011   MCHC 32.7 02/16/2019 2011   RDW 13.6 02/16/2019 2011   LYMPHSABS 1.9 12/15/2018 1152   MONOABS 0.8 12/15/2018 1152   EOSABS 0.4 12/15/2018 1152   BASOSABS 0.1 12/15/2018 1152  CMP Latest Ref Rng & Units 02/16/2019 12/17/2018 12/15/2018  Glucose 70 - 99 mg/dL 145(H) 113(H) 129(H)  BUN 8 - 23 mg/dL 25(H) 13 29(H)  Creatinine 0.61 - 1.24 mg/dL 0.98 0.96 1.14  Sodium 135 - 145 mmol/L 136 137 134(L)  Potassium 3.5 - 5.1 mmol/L 3.4(L) 4.2 3.6  Chloride 98 - 111 mmol/L 100 105 104  CO2 22 - 32 mmol/L 27 22 21(L)  Calcium 8.9 - 10.3 mg/dL 9.4 9.1 9.2  Total Protein 6.5 - 8.1 g/dL - - 7.8  Total Bilirubin 0.3 - 1.2 mg/dL - - 0.5  Alkaline Phos 38 - 126 U/L - - 59  AST 15 - 41 U/L - - 17  ALT 0 - 44 U/L - - 14       DIAGNOSTIC IMAGING:  I have independently reviewed the scans and discussed with the patient.   I have reviewed Venita Lick LPN's note and agree with the documentation.  I personally performed a face-to-face visit, made revisions and my assessment and plan is as follows.    ASSESSMENT & PLAN:   Squamous cell carcinoma of neck 1.  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 -He presented to the ER on 11/29/2018, CT of the neck showed bilateral necrotic lymphadenopathy in the neck, right more than left, largest lymph node measuring 3.9 cm, with a 17 mm right palatine tonsil mass which is submucosal.  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  He was started on Xarelto. -He underwent needle biopsy of the right neck lymph node on 12/02/2018, consistent with squamous cell carcinoma, P 16+. - CT CAP on 12/03/2018 shows 7 mm short axis left supraclavicular lymph node, indeterminate.  Otherwise no metastatic disease.  3.3 x 3.4 cm infrarenal abdominal aortic aneurysm. - Biopsy of the right neck lymph node on 12/02/2018 was consistent with squamous  cell carcinoma.  This was P 16+. - He did not follow-up with Dr. Nicolette Bang at [facility].  He was evaluated by Dr.Yanagihara.  He also missed follow-ups for port placement.  He missed follow-ups after that.  I have evaluated him over the phone in March when he was losing weight. -He is accompanied by his daughter-in-law. -PET scan on 04/14/2019 shows progression of the right tonsillar primary and adjacent nodal metastasis.  Bilateral pulmonary nodules including a hypermetabolic right lower lobe nodule, highly suspicious for pulmonary metastatic disease.  New 11th medial left rib fracture with hypermetabolism which could be posttraumatic. - He is eating soft foods.  He is eating oatmeal, ice cream.  Not able to drink regular Ensure's as it was too thick.  He could not find Ensure clear in the stores.  I have made a referral for dietary consult. -We discussed the results of the PD-L1 CPS score which is 25%.  He is likely to get better response of the score is more than 20%. -We talked about further management options including best supportive care in the form of hospice versus treatment with pembrolizumab.  He wanted to try a pembrolizumab. -We talked about side effects of pembrolizumab in detail.  It is given every 3 weeks at a dose of 200 mg.  We also talked about the need for port placement.  We will make a referral. - I will see him back prior to each treatment.   2.  Right IJ thrombosis: -This was found incidentally on  CT of the neck on 11/29/2018.  He was started on Xarelto which was discontinued secondary to melena on 12/15/2018.    Total time spent is 40 minutes with more than 50% of the time spent face-to-face discussing scan results, management options, side effects and coordination of care.    Orders placed this encounter:  Orders Placed This Encounter  Procedures  . CBC with Differential/Platelet  . Comprehensive metabolic panel      Derek Charleton, MD [place] [phone]

## 2019-05-06 NOTE — Progress Notes (Signed)
Documentation: Provided patient numerous sample of Ensure Clear/Boost Soothe. Ordered case of Ensure clear at pharmacy per nursing request. Will notify pt/family when arrives.   Burtis Junes RD, LDN, CNSC Clinical Nutrition Available Tues-Sat via Pager: [phone] 05/06/2019 12:33 PM

## 2019-05-06 NOTE — Patient Instructions (Addendum)
[place] at [facility] Discharge Instructions  You were seen today by Dr. Delton Coombes. He went over your recent test results. He will schedule him to have a port placed.  He will see you back in 1 week for labs and follow up.   Thank you for choosing [place] at [facility] to provide your oncology and hematology care.  To afford each patient quality time with our provider, please arrive at least 15 minutes before your scheduled appointment time.   If you have a lab appointment with the [facility] please come in thru the  Main Entrance and check in at the main information desk  You need to re-schedule your appointment should you arrive 10 or more minutes late.  We strive to give you quality time with our providers, and arriving late affects you and other patients whose appointments are after yours.  Also, if you no show three or more times for appointments you may be dismissed from the clinic at the providers discretion.     Again, thank you for choosing [facility].  Our hope is that these requests will decrease the amount of time that you wait before being seen by our physicians.       _____________________________________________________________  Should you have questions after your visit to [facility], please contact our office at [phone] between the hours of 8:00 a.m. and 4:30 p.m.  Voicemails left after 4:00 p.m. will not be returned until the following business day.  For prescription refill requests, have your pharmacy contact our office and allow 72 hours.    Cancer Center Support Programs:   > Cancer Support Group  2nd Tuesday of the month 1pm-2pm, Journey Room

## 2019-05-06 NOTE — Assessment & Plan Note (Addendum)
1.  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 -He presented to the ER on 11/29/2018, CT of the neck showed bilateral necrotic lymphadenopathy in the neck, right more than left, largest lymph node measuring 3.9 cm, with a 17 mm right palatine tonsil mass which is submucosal.  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  He was started on Xarelto. -He underwent needle biopsy of the right neck lymph node on 12/02/2018, consistent with squamous cell carcinoma, P 16+. - CT CAP on 12/03/2018 shows 7 mm short axis left supraclavicular lymph node, indeterminate.  Otherwise no metastatic disease.  3.3 x 3.4 cm infrarenal abdominal aortic aneurysm. - Biopsy of the right neck lymph node on 12/02/2018 was consistent with squamous cell carcinoma.  This was P 16+. - He did not follow-up with Dr. Nicolette Bang at [facility].  He was evaluated by Dr.Yanagihara.  He also missed follow-ups for port placement.  He missed follow-ups after that.  I have evaluated him over the phone in March when he was losing weight. -He is accompanied by his daughter-in-law. -PET scan on 04/14/2019 shows progression of the right tonsillar primary and adjacent nodal metastasis.  Bilateral pulmonary nodules including a hypermetabolic right lower lobe nodule, highly suspicious for pulmonary metastatic disease.  New 11th medial left rib fracture with hypermetabolism which could be posttraumatic. - He is eating soft foods.  He is eating oatmeal, ice cream.  Not able to drink regular Ensure's as it was too thick.  He could not find Ensure clear in the stores.  I have made a referral for dietary consult. -We discussed the results of the PD-L1 CPS score which is 25%.  He is likely to get better response of the score is more than 20%. -We talked about further management options including best supportive care in the form of hospice versus  treatment with pembrolizumab.  He wanted to try a pembrolizumab.  Treatment intent in the palliative setting was discussed in detail with the patient and his daughter-in-law. -We talked about side effects of pembrolizumab in detail.  It is given every 3 weeks at a dose of 200 mg.  We also talked about the need for port placement.  We will make a referral. - I will see him back prior to each treatment.   2.  Right IJ thrombosis: -This was found incidentally on CT of the neck on 11/29/2018.  He was started on Xarelto which was discontinued secondary to melena on 12/15/2018.

## 2019-05-07 ENCOUNTER — Other Ambulatory Visit (HOSPITAL_COMMUNITY): Payer: Self-pay | Admitting: *Deleted

## 2019-05-08 ENCOUNTER — Telehealth (HOSPITAL_COMMUNITY): Payer: Self-pay | Admitting: Dietician

## 2019-05-08 ENCOUNTER — Encounter (HOSPITAL_COMMUNITY): Payer: Self-pay

## 2019-05-08 DIAGNOSIS — Z95828 Presence of other vascular implants and grafts: Secondary | ICD-10-CM

## 2019-05-08 HISTORY — DX: Presence of other vascular implants and grafts: Z95.828

## 2019-05-08 MED ORDER — LIDOCAINE-PRILOCAINE 2.5-2.5 % EX CREA: TOPICAL_CREAM | CUTANEOUS | 3 refills | Status: AC

## 2019-05-08 MED ORDER — PROCHLORPERAZINE MALEATE 10 MG PO TABS: 10.0000 mg | ORAL_TABLET | Freq: Four times a day (QID) | ORAL | 1 refills | Status: AC | PRN

## 2019-05-08 NOTE — Patient Instructions (Signed)
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  You will be treated every 21 days with an immunotherapy medication, Keytruda (pembrolizumab).  The intent of this therapy is to control your cancer and to alleviate any symptoms you may be having related to your cancer.  You will see the doctor regularly throughout treatment.  We monitor your lab work prior to every treatment. The doctor monitors your response to treatment by the way you are feeling, your blood work, and scans periodically.  There will be wait times while you are here for treatment.  It will take about 30 minutes to 1 hour for your lab work to result.  Then there will be wait times while pharmacy mixes your medications.   Pembrolizumab Beryle Flock)  About This Drug Pembrolizumab is used to treat cancer. It is given in the vein (IV).  Possible Side Effects . Nausea . Diarrhea (loose bowel movements) . Constipation (not able to move bowels) . Pain in your abdomen . Tiredness . Fever . Decreased appetite (decreased hunger) . Muscle and bone pain . Trouble breathing . Cough . Rash . Itching  Note: Each of the side effects above was reported in 20% or greater of patients treated with pembrolizumab. Your side effects may be different if you are receiving pembrolizumab in combination with another chemotherapy agent. Not all possible side effects are included above.  Warnings and Precautions  . This drug works with your immune system and can cause inflammation in any of your organs and tissues and can change how they work. This may put you at risk for developing serious medical problems, which can be life-threatening. . Inflammation in the colon (colitis), which can be life-threatening - symptoms are loose bowel movements (diarrhea) stomach cramping, and sometimes blood in the bowel movements . Changes in liver function . Changes in kidney  function . Inflammation (swelling) of the lungs, which can be life-threatening. You may have a dry cough or trouble breathing.  SELF CARE ACTIVITIES WHILE ON IMMUNOTHERAPY:  Hydration Increase your fluid intake 48 hours prior to treatment and drink at least 8 to 12 cups (64 ounces) of water/decaffeinated beverages per day after treatment. You can still have your cup of coffee or soda but these beverages do not count as part of your 8 to 12 cups that you need to drink daily. No alcohol intake.  Medications Continue taking your normal prescription medication as prescribed.  If you start any new herbal or new supplements please let us know first to make sure it is safe.  Mouth Care Have teeth cleaned professionally before starting treatment. Keep dentures and partial plates clean. Use soft toothbrush and do not use mouthwashes that contain alcohol. Biotene is a good mouthwash that is available at most pharmacies or may be ordered by calling [phone]. Use warm salt water gargles (1 teaspoon salt per 1 quart warm water) before and after meals and at bedtime. Or you may rinse with 2 tablespoons of three-percent hydrogen peroxide mixed in eight ounces of water. If you are still having problems with your mouth or sores in your mouth please call the clinic. If you need dental work, please let the doctor know before you go for your appointment so that we can coordinate the best possible time for you in regards to your chemo regimen. You need to also let your dentist know that you are actively taking chemo. We may need to do  labs prior to your dental appointment.  Skin Care Always use sunscreen that has not expired and with SPF (Sun Protection Factor) of 50 or higher. Wear hats to protect your head from the sun. Remember to use sunscreen on your hands, ears, face, & feet.  Use good moisturizing lotions such as udder cream, eucerin, or even Vaseline. Some chemotherapies can cause dry skin, color changes  in your skin and nails.    . Avoid long, hot showers or baths. . Use gentle, fragrance-free soaps and laundry detergent. . Use moisturizers, preferably creams or ointments rather than lotions because the thicker consistency is better at preventing skin dehydration. Apply the cream or ointment within 15 minutes of showering. Reapply moisturizer at night, and moisturize your hands every time after you wash them.   Infection Prevention Please wash your hands for at least 30 seconds using warm soapy water. Handwashing is the #1 way to prevent the spread of germs. Stay away from sick people or people who are getting over a cold. If you develop respiratory systems such as green/yellow mucus production or productive cough or persistent cough let us know and we will see if you need an antibiotic. It is a good idea to keep a pair of gloves on when going into grocery stores/[employer] to decrease your risk of coming into contact with germs on the carts, etc. Carry alcohol hand gel with you at all times and use it frequently if out in public. If your temperature reaches 100.5 or higher please call the clinic and let us know.  If it is after hours or on the weekend please go to the ER if your temperature is over 100.4.  Please have your own personal thermometer at home to use.    Sex and bodily fluids If you are going to have sex, a condom must be used to protect the person that isn't taking immunotherapy. For a few days after treatment, immunotherapy can be excreted through your bodily fluids.  When using the toilet please close the lid and flush the toilet twice.  Do this for a few day after you have had immunotherapy.   Contraception It is not known for sure whether or not immunotherapy drugs can be passed on through semen or secretions from the vagina. Because of this some doctors advise people to use a barrier method if you have sex during treatment. This applies to vaginal, anal or oral sex.  Generally,  doctors advise a barrier method only for the time you are actually having the treatment and for about a week after your treatment.  Advice like this can be worrying, but this does not mean that you have to avoid being intimate with your partner. You can still have close contact with your partner and continue to enjoy sex.  Animals If you have cats or birds we just ask that you not change the litter or change the cage.  Please have someone else do this for you while you are on immunotherapy.   Food Safety During and After Cancer Treatment Food safety is important for people both during and after cancer treatment. Cancer and cancer treatments, such as chemotherapy, radiation therapy, and stem cell/bone marrow transplantation, often weaken the immune system. This makes it harder for your body to protect itself from foodborne illness, also called food poisoning. Foodborne illness is caused by eating food that contains harmful bacteria, parasites, or viruses.  Foods to avoid Some foods have a higher risk of becoming tainted with bacteria.  These include: Marland Kitchen Unwashed fresh fruit and vegetables, especially leafy vegetables that can hide dirt and other contaminants . Raw sprouts, such as alfalfa sprouts . Raw or undercooked beef, especially ground beef, or other raw or undercooked meat and poultry . Fatty, fried, or spicy foods immediately before or after treatment.  These can sit heavy on your stomach and make you feel nauseous. . Raw or undercooked shellfish, such as oysters. . Sushi and sashimi, which often contain raw fish.  . Unpasteurized beverages, such as unpasteurized fruit juices, raw milk, raw yogurt, or cider . Undercooked eggs, such as soft boiled, over easy, and poached; raw, unpasteurized eggs; or foods made with raw egg, such as homemade raw cookie dough and homemade mayonnaise  Simple steps for food safety  Shop smart. . Do not buy food stored or displayed in an unclean area. . Do not  buy bruised or damaged fruits or vegetables. . Do not buy cans that have cracks, dents, or bulges. . Pick up foods that can spoil at the end of your shopping trip and store them in a cooler on the way home.  Prepare and clean up foods carefully. . Rinse all fresh fruits and vegetables under running water, and dry them with a clean towel or paper towel. . Clean the top of cans before opening them. . After preparing food, wash your hands for 20 seconds with hot water and soap. Pay special attention to areas between fingers and under nails. . Clean your utensils and dishes with hot water and soap. Marland Kitchen Disinfect your kitchen and cutting boards using 1 teaspoon of liquid, unscented bleach mixed into 1 quart of water.    Dispose of old food. . Eat canned and packaged food before its expiration date (the "use by" or "best before" date). . Consume refrigerated leftovers within 3 to 4 days. After that time, throw out the food. Even if the food does not smell or look spoiled, it still may be unsafe. Some bacteria, such as Listeria, can grow even on foods stored in the refrigerator if they are kept for too long.  Take precautions when eating out. . At restaurants, avoid buffets and salad bars where food sits out for a long time and comes in contact with many people. Food can become contaminated when someone with a virus, often a norovirus, or another "bug" handles it. . Put any leftover food in a "to-go" container yourself, rather than having the server do it. And, refrigerate leftovers as soon as you get home. . Choose restaurants that are clean and that are willing to prepare your food as you order it cooked.     SYMPTOMS TO REPORT AS SOON AS POSSIBLE AFTER TREATMENT:   FEVER GREATER THAN 100.5 F  CHILLS WITH OR WITHOUT FEVER  NAUSEA AND VOMITING THAT IS NOT CONTROLLED WITH YOUR NAUSEA MEDICATION  UNUSUAL SHORTNESS OF BREATH  UNUSUAL BRUISING OR BLEEDING  TENDERNESS IN MOUTH AND THROAT WITH  OR WITHOUT PRESENCE OF ULCERS  URINARY PROBLEMS  BOWEL PROBLEMS  UNUSUAL RASH      Wear comfortable clothing and clothing appropriate for easy access to any Portacath or PICC line. Let us know if there is anything that we can do to make your therapy better!    What to do if you need assistance after hours or on the weekends: CALL [phone].  HOLD on the line, do not hang up.  You will hear multiple messages but at the end you will be connected with  a nurse triage line.  They will contact the doctor if necessary.  Most of the time they will be able to assist you.  Do not call the hospital operator.      I have been informed and understand all of the instructions given to me and have received a copy. I have been instructed to call the clinic [phone] or my family physician as soon as possible for continued medical care, if indicated. I do not have any more questions at this time but understand that I may call the [place] or the Patient Navigator at [phone] during office hours should I have questions or need assistance in obtaining follow-up care.

## 2019-05-08 NOTE — Telephone Encounter (Signed)
Called pt to notify that the case of Ensure Clear had arrived and will be available for pickup tomorrow morning. Got VM. Left message.  Burtis Junes RD, LDN, CNSC Clinical Nutrition Available Tues-Sat via Pager: [phone] 05/08/2019 12:44 PM

## 2019-05-09 ENCOUNTER — Inpatient Hospital Stay (HOSPITAL_COMMUNITY): Payer: Medicare HMO

## 2019-05-09 ENCOUNTER — Other Ambulatory Visit: Payer: Self-pay

## 2019-05-09 ENCOUNTER — Encounter (HOSPITAL_COMMUNITY): Payer: Self-pay

## 2019-05-09 MED ORDER — SODIUM CHLORIDE 0.9 % IV SOLN
INTRAVENOUS | Status: DC
  Administered 2019-05-09: 14:00:00 via INTRAVENOUS

## 2019-05-09 MED ORDER — ZOLEDRONIC ACID 4 MG/100ML IV SOLN
4.0000 mg | Freq: Once | INTRAVENOUS | Status: AC
  Administered 2019-05-09: 4 mg via INTRAVENOUS
  Filled 2019-05-09: qty 100

## 2019-05-09 NOTE — Progress Notes (Signed)
Patient presented today for hydration fluids and possibly zometa, will await authorization.   No need for authorization per pharmacist when pt has hypercalcemia.  Zometa and fluids given today per orders. Patient tolerated it well without problems. Vitals stable and discharged home from clinic via wheelchair.  Follow up as scheduled.

## 2019-05-09 NOTE — Patient Instructions (Signed)
[facility] Discharge Instructions for Patients Receiving Chemotherapy  Today you received the following chemotherapy agents   To help prevent nausea and vomiting after your treatment, we encourage you to take your nausea medication   If you develop nausea and vomiting that is not controlled by your nausea medication, call the clinic.   BELOW ARE SYMPTOMS THAT SHOULD BE REPORTED IMMEDIATELY:  *FEVER GREATER THAN 100.5 F  *CHILLS WITH OR WITHOUT FEVER  NAUSEA AND VOMITING THAT IS NOT CONTROLLED WITH YOUR NAUSEA MEDICATION  *UNUSUAL SHORTNESS OF BREATH  *UNUSUAL BRUISING OR BLEEDING  TENDERNESS IN MOUTH AND THROAT WITH OR WITHOUT PRESENCE OF ULCERS  *URINARY PROBLEMS  *BOWEL PROBLEMS  UNUSUAL RASH Items with * indicate a potential emergency and should be followed up as soon as possible.  Feel free to call the clinic should you have any questions or concerns. The clinic phone number is [phone].  Please show the CHEMO ALERT CARD at check-in to the Emergency Department and triage nurse.   

## 2019-05-13 ENCOUNTER — Ambulatory Visit: Payer: Medicare HMO | Admitting: General Surgery

## 2019-05-13 ENCOUNTER — Other Ambulatory Visit: Payer: Self-pay

## 2019-05-13 ENCOUNTER — Inpatient Hospital Stay (HOSPITAL_COMMUNITY): Payer: Medicare HMO

## 2019-05-13 DIAGNOSIS — C4442 Squamous cell carcinoma of skin of scalp and neck: Secondary | ICD-10-CM

## 2019-05-13 DIAGNOSIS — Z95828 Presence of other vascular implants and grafts: Secondary | ICD-10-CM

## 2019-05-13 DIAGNOSIS — Z5112 Encounter for antineoplastic immunotherapy: Secondary | ICD-10-CM

## 2019-05-15 ENCOUNTER — Ambulatory Visit (INDEPENDENT_AMBULATORY_CARE_PROVIDER_SITE_OTHER): Payer: Medicare HMO | Admitting: General Surgery

## 2019-05-15 ENCOUNTER — Encounter: Payer: Self-pay | Admitting: General Surgery

## 2019-05-15 ENCOUNTER — Other Ambulatory Visit (HOSPITAL_COMMUNITY)
Admission: RE | Admit: 2019-05-15 | Discharge: 2019-05-15 | Disposition: A | Discharge status: Home / Self Care | Payer: Medicare HMO | Source: Ambulatory Visit | Attending: General Surgery | Admitting: General Surgery

## 2019-05-15 ENCOUNTER — Other Ambulatory Visit: Payer: Self-pay

## 2019-05-15 VITALS — BP 121/81 | HR 93 | Temp 99.1°F | Resp 18 | Ht 65.0 in | Wt 124.0 lb

## 2019-05-15 DIAGNOSIS — C4442 Squamous cell carcinoma of skin of scalp and neck: Secondary | ICD-10-CM | POA: Diagnosis not present

## 2019-05-15 DIAGNOSIS — Z1159 Encounter for screening for other viral diseases: Secondary | ICD-10-CM | POA: Insufficient documentation

## 2019-05-15 NOTE — H&P (Signed)
Miguel Moreno; [id number]; 17-Apr-1951   HPI Patient is a 68 year old white male who was referred to my care by Dr. Delton Coombes for Port-A-Cath placement.  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  He does have difficulties controlling secretions.  He currently has 7 out of 10 pain which is diffuse in his body.  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  The subclavian veins appear to be fine. Past Medical History:  Diagnosis Date  . Anxiety and depression   . Arthritis    "all over; joints" (12/16/2018)  . Chronic lower back pain   . GERD (gastroesophageal reflux disease)    "constantly" (12/16/2018)  . Heart murmur   . 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 Melena 12/15/2018  . Pneumonia    "3 times when I was a younger kid" (12/16/2018)  . Port-A-Cath in place 05/08/2019  . PUD (peptic ulcer disease)   . SCC (squamous cell carcinoma)     left neck; "recent bx" (12/16/2018)  . Sinus headache    "a few/wk" (12/16/2018)  . Tinnitus, bilateral    "constantly" (12/16/2018)    Past Surgical History:  Procedure Laterality Date  . CATARACT EXTRACTION W/ INTRAOCULAR LENS IMPLANT Left   . SKIN BIOPSY Left 2019   neck    History reviewed. No pertinent family history.  Current Outpatient Medications on File Prior to Visit  Medication Sig Dispense Refill  . acetaminophen (TYLENOL) 500 MG tablet Take 500 mg by mouth every 6 (six) hours as needed for moderate pain.    Marland Kitchen lidocaine-prilocaine (EMLA) cream Apply a small amount to port a cath site and cover with plastic wrap one hour prior to infusion appointment 30 g 3  . ondansetron (ZOFRAN ODT) 8 MG disintegrating tablet Take 1 tablet (8 mg total) by mouth every 8 (eight) hours as needed for nausea or vomiting. 12 tablet 0  . pantoprazole (PROTONIX) 40 MG tablet Take 1 tablet (40 mg total) by mouth daily. (Patient taking differently: Take 40 mg  by mouth daily as needed (for GERD). ) 30 tablet 1  . [START ON 05/20/2019] Pembrolizumab (KEYTRUDA IV) Inject into the vein. Every 21 days    . prochlorperazine (COMPAZINE) 10 MG tablet Take 1 tablet (10 mg total) by mouth every 6 (six) hours as needed (Nausea or vomiting). 30 tablet 1  . senna-docusate (SENOKOT-S) 8.6-50 MG tablet Take 1 tablet by mouth at bedtime as needed for mild constipation or moderate constipation. 20 tablet 0  . traMADol (ULTRAM) 50 MG tablet Take 1 tablet (50 mg total) by mouth every 6 (six) hours as needed for moderate pain. 20 tablet 0   No current facility-administered medications on file prior to visit.     No Known Allergies  Social History   Substance and Sexual Activity  Alcohol Use Yes  . Frequency: Never   Comment: 12/16/2018 "might have a drink q couple months"    Social History   Tobacco Use  Smoking Status Current Every Day Smoker  . Packs/day: 1.00  . Years: 52.00  . Pack years: 52.00  . Types: Cigarettes  Smokeless Tobacco Never Used    Review of Systems  Constitutional: Positive for malaise/fatigue.  HENT: Positive for congestion, ear pain, sinus pain and sore throat.   Eyes: Negative.   Respiratory: Positive for cough and shortness of breath.   Cardiovascular: Negative.   Gastrointestinal: Positive for  heartburn and nausea.  Genitourinary: Negative.   Musculoskeletal: Positive for back pain, joint pain and neck pain.  Skin: Negative.   Neurological: Negative.   Endo/Heme/Allergies: Negative.   Psychiatric/Behavioral: Negative.     Objective   Vitals:   05/15/19 1119  BP: 121/81  Pulse: 93  Resp: 18  Temp: 99.1 F (37.3 C)  SpO2: 96%    Physical Exam Vitals signs reviewed.  Constitutional:      Appearance: He is not ill-appearing.  HENT:     Head: Normocephalic and atraumatic.     Mouth/Throat:     Comments: Patient spitting frequently. Cardiovascular:     Rate and Rhythm: Normal rate and regular rhythm.      Heart sounds: Normal heart sounds. No murmur. No friction rub. No gallop.   Pulmonary:     Effort: Pulmonary effort is normal. No respiratory distress.     Breath sounds: Normal breath sounds. No stridor. No wheezing, rhonchi or rales.  Skin:    General: Skin is warm and dry.  Neurological:     Mental Status: He is alert and oriented to person, place, and time.   Dr. Tomie China notes reviewed  Assessment  Squamous cell carcinoma of the right neck Plan   Patient is scheduled for Port-A-Cath insertion on 05/19/2019.  The risks and benefits of the procedure including bleeding, infection, and pneumothorax were fully explained to the patient, who gave informed consent.

## 2019-05-15 NOTE — Progress Notes (Signed)
Miguel Moreno; [id number]; [birth year]   HPI Patient is a 68 year old white male who was referred to my care by Dr. Delton Coombes for Port-A-Cath placement.  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  He does have difficulties controlling secretions.  He currently has 7 out of 10 pain which is diffuse in his body.  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  The subclavian veins appear to be fine. Past Medical History:  Diagnosis Date  . Anxiety and depression   . Arthritis    "all over; joints" (12/16/2018)  . Chronic lower back pain   . GERD (gastroesophageal reflux disease)    "constantly" (12/16/2018)  . Heart murmur   . 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 Melena 12/15/2018  . Pneumonia    "3 times when I was a younger kid" (12/16/2018)  . Port-A-Cath in place 05/08/2019  . PUD (peptic ulcer disease)   . SCC (squamous cell carcinoma)     left neck; "recent bx" (12/16/2018)  . Sinus headache    "a few/wk" (12/16/2018)  . Tinnitus, bilateral    "constantly" (12/16/2018)    Past Surgical History:  Procedure Laterality Date  . CATARACT EXTRACTION W/ INTRAOCULAR LENS IMPLANT Left   . SKIN BIOPSY Left 2019   neck    History reviewed. No pertinent family history.  Current Outpatient Medications on File Prior to Visit  Medication Sig Dispense Refill  . acetaminophen (TYLENOL) 500 MG tablet Take 500 mg by mouth every 6 (six) hours as needed for moderate pain.    Marland Kitchen lidocaine-prilocaine (EMLA) cream Apply a small amount to port a cath site and cover with plastic wrap one hour prior to infusion appointment 30 g 3  . ondansetron (ZOFRAN ODT) 8 MG disintegrating tablet Take 1 tablet (8 mg total) by mouth every 8 (eight) hours as needed for nausea or vomiting. 12 tablet 0  . pantoprazole (PROTONIX) 40 MG tablet Take 1 tablet (40 mg total) by mouth daily. (Patient taking differently: Take 40 mg  by mouth daily as needed (for GERD). ) 30 tablet 1  . [START ON 05/20/2019] Pembrolizumab (KEYTRUDA IV) Inject into the vein. Every 21 days    . prochlorperazine (COMPAZINE) 10 MG tablet Take 1 tablet (10 mg total) by mouth every 6 (six) hours as needed (Nausea or vomiting). 30 tablet 1  . senna-docusate (SENOKOT-S) 8.6-50 MG tablet Take 1 tablet by mouth at bedtime as needed for mild constipation or moderate constipation. 20 tablet 0  . traMADol (ULTRAM) 50 MG tablet Take 1 tablet (50 mg total) by mouth every 6 (six) hours as needed for moderate pain. 20 tablet 0   No current facility-administered medications on file prior to visit.     No Known Allergies  Social History   Substance and Sexual Activity  Alcohol Use Yes  . Frequency: Never   Comment: 12/16/2018 "might have a drink q couple months"    Social History   Tobacco Use  Smoking Status Current Every Day Smoker  . Packs/day: 1.00  . Years: 52.00  . Pack years: 52.00  . Types: Cigarettes  Smokeless Tobacco Never Used    Review of Systems  Constitutional: Positive for malaise/fatigue.  HENT: Positive for congestion, ear pain, sinus pain and sore throat.   Eyes: Negative.   Respiratory: Positive for cough and shortness of breath.   Cardiovascular: Negative.   Gastrointestinal: Positive for  heartburn and nausea.  Genitourinary: Negative.   Musculoskeletal: Positive for back pain, joint pain and neck pain.  Skin: Negative.   Neurological: Negative.   Endo/Heme/Allergies: Negative.   Psychiatric/Behavioral: Negative.     Objective   Vitals:   05/15/19 1119  BP: 121/81  Pulse: 93  Resp: 18  Temp: 99.1 F (37.3 C)  SpO2: 96%    Physical Exam Vitals signs reviewed.  Constitutional:      Appearance: He is not ill-appearing.  HENT:     Head: Normocephalic and atraumatic.     Mouth/Throat:     Comments: Patient spitting frequently. Cardiovascular:     Rate and Rhythm: Normal rate and regular rhythm.      Heart sounds: Normal heart sounds. No murmur. No friction rub. No gallop.   Pulmonary:     Effort: Pulmonary effort is normal. No respiratory distress.     Breath sounds: Normal breath sounds. No stridor. No wheezing, rhonchi or rales.  Skin:    General: Skin is warm and dry.  Neurological:     Mental Status: He is alert and oriented to person, place, and time.   Dr. Tomie China notes reviewed  Assessment  Squamous cell carcinoma of the right neck Plan   Patient is scheduled for Port-A-Cath insertion on 05/19/2019.  The risks and benefits of the procedure including bleeding, infection, and pneumothorax were fully explained to the patient, who gave informed consent.

## 2019-05-15 NOTE — Patient Instructions (Signed)

## 2019-05-16 ENCOUNTER — Encounter (HOSPITAL_COMMUNITY)
Admission: RE | Admit: 2019-05-16 | Discharge: 2019-05-16 | Disposition: A | Discharge status: Home / Self Care | Payer: Medicare HMO | Source: Ambulatory Visit | Attending: General Surgery | Admitting: General Surgery

## 2019-05-16 LAB — NOVEL CORONAVIRUS, NAA (HOSP ORDER, SEND-OUT TO REF LAB; TAT 18-24 HRS): SARS-CoV-2, NAA: NOT DETECTED

## 2019-05-19 ENCOUNTER — Encounter (HOSPITAL_COMMUNITY): Admission: RE | Payer: Self-pay | Source: Home / Self Care

## 2019-05-19 ENCOUNTER — Ambulatory Visit (HOSPITAL_COMMUNITY): Admission: RE | Admit: 2019-05-19 | Payer: Medicare HMO | Source: Home / Self Care | Admitting: General Surgery

## 2019-05-19 SURGERY — INSERTION, TUNNELED CENTRAL VENOUS DEVICE, WITH PORT
Anesthesia: Monitor Anesthesia Care | Laterality: Left

## 2019-05-20 ENCOUNTER — Other Ambulatory Visit (HOSPITAL_COMMUNITY): Payer: Medicare HMO

## 2019-05-20 ENCOUNTER — Ambulatory Visit (HOSPITAL_COMMUNITY): Payer: Medicare HMO

## 2019-05-21 ENCOUNTER — Encounter (HOSPITAL_COMMUNITY): Payer: Self-pay | Admitting: *Deleted

## 2019-05-21 NOTE — Progress Notes (Signed)
I spoke with patient's son today as patient was still sleeping. He states that his dad at this time is not sure if he wants to get treatment or get the port a cath put in. He states that his dad doesn't want to get anything done early in the morning and I explained to him that his appt for the OR was based on their schedule and they work early mornings.  He said he just isn't a morning person.  I also expressed the need to reschedule his treatment appt and he said he has my number as it came up on caller-ID and he would talk to his dad this evening when he wakes up and they would come to a decision and call me back.  I thanked him for his time.

## 2019-06-10 ENCOUNTER — Ambulatory Visit (HOSPITAL_COMMUNITY): Payer: Medicare HMO | Admitting: Hematology

## 2019-06-10 ENCOUNTER — Other Ambulatory Visit (HOSPITAL_COMMUNITY): Payer: Medicare HMO

## 2019-06-10 ENCOUNTER — Ambulatory Visit (HOSPITAL_COMMUNITY): Payer: Medicare HMO

## 2019-06-18 DEATH — deceased

## 2019-07-11 IMAGING — PT NUCLEAR MEDICINE PET IMAGE INITIAL (PI) SKULL BASE TO THIGH
3 series · 22 of 25 positions shown · non-contrast
Comparison: No prior PET. CT of the neck of 11/29/2018. Chest
abdomen and pelvic CTs of 12/03/2018.

CLINICAL DATA: Initial treatment strategy for squamous cell
carcinoma of the neck.

EXAM:
NUCLEAR MEDICINE PET SKULL BASE TO THIGH
TECHNIQUE: 10.4 mCi F-18 FDG was injected intravenously. Full-ring PET imaging
was performed from the skull base to thigh after the radiotracer. CT
data was obtained and used for attenuation correction and anatomic
localization.
Fasting blood glucose: 126 mg/dl

[mip · 4 of 48 slices shown]
[im 1/48]
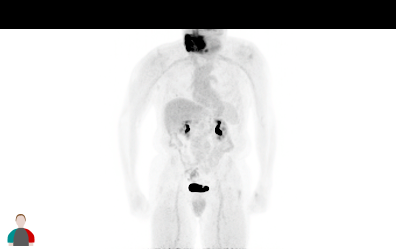
[im 12/48]
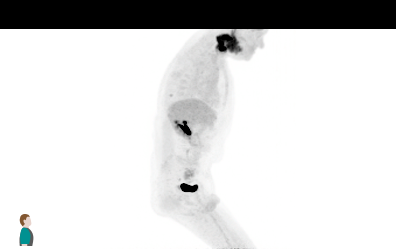
[im 24/48]
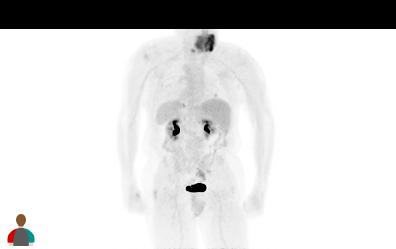
[im 36/48]
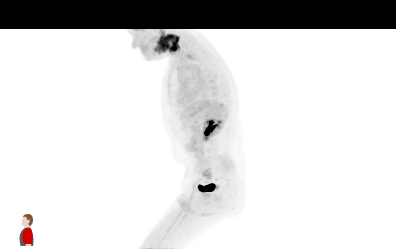

[axial ct wb fusion · 14 of 164 slices shown]
[im 1/164]
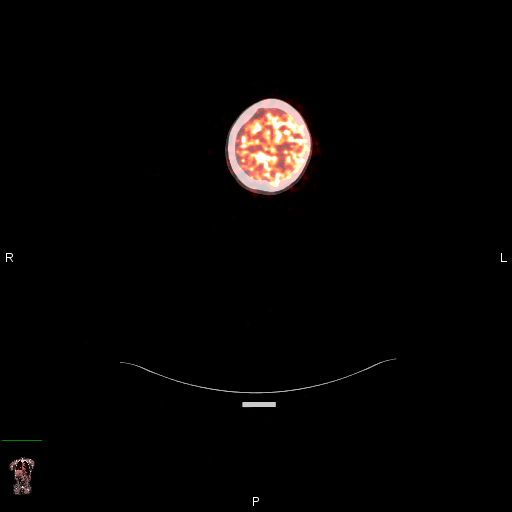
[im 11/164]
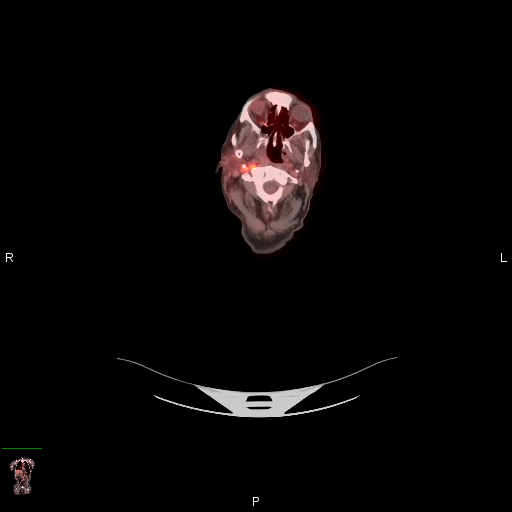
[im 22/164]
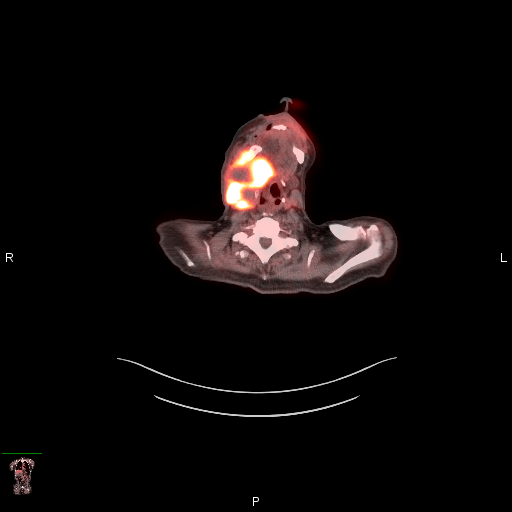
[im 33/164]
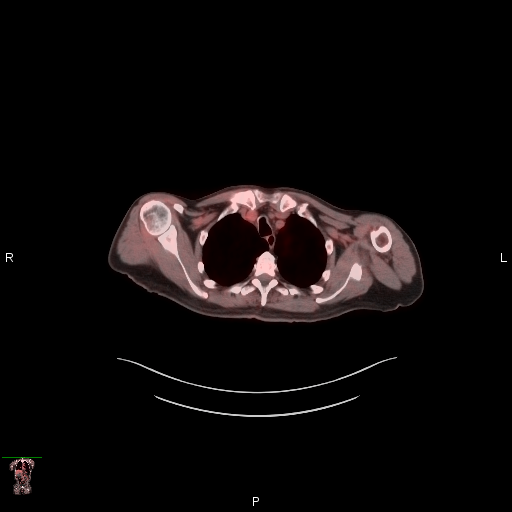
[im 44/164]
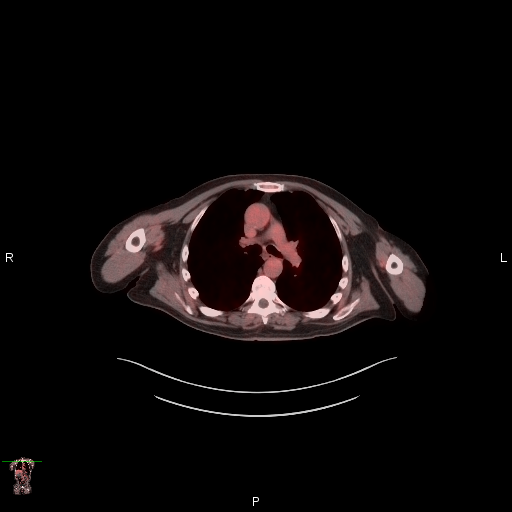
[im 55/164]
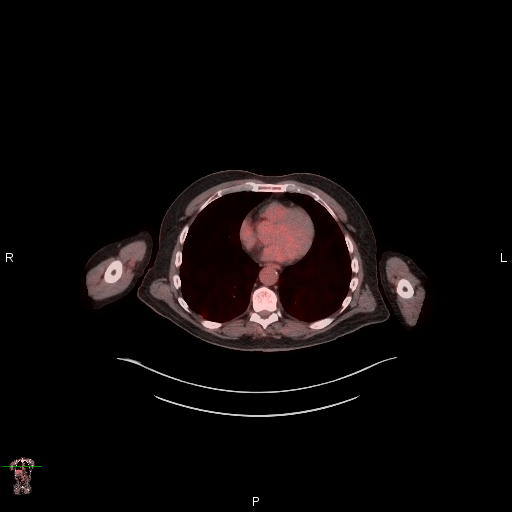
[im 66/164]
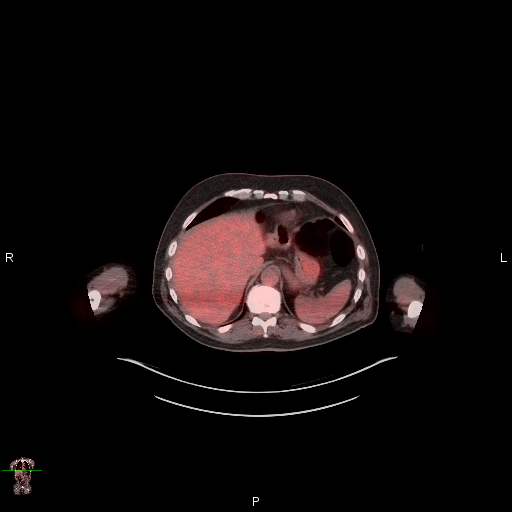
[im 87/164]
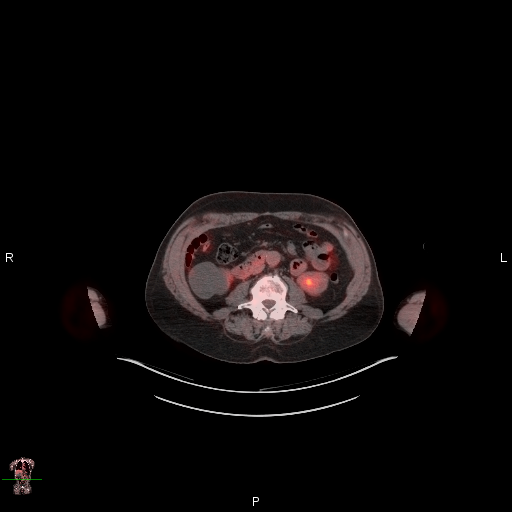
[im 98/164]
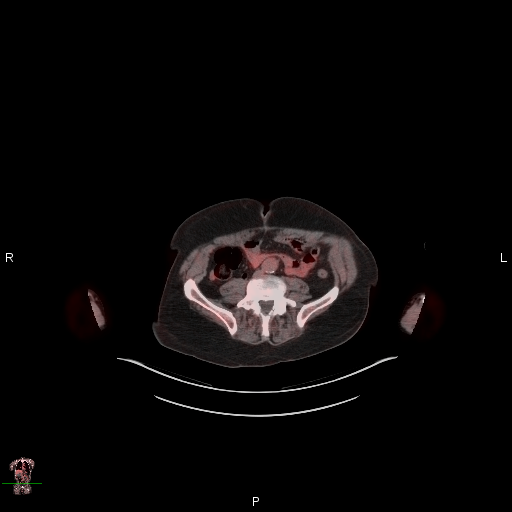
[im 109/164]
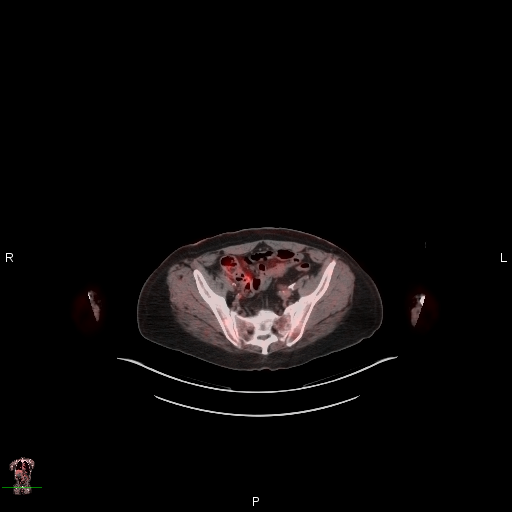
[im 120/164]
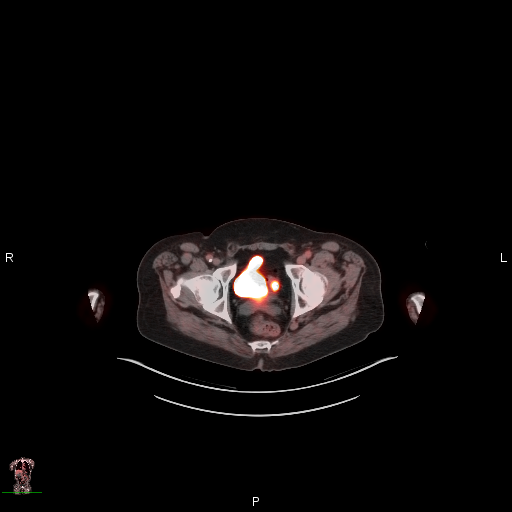
[im 131/164]
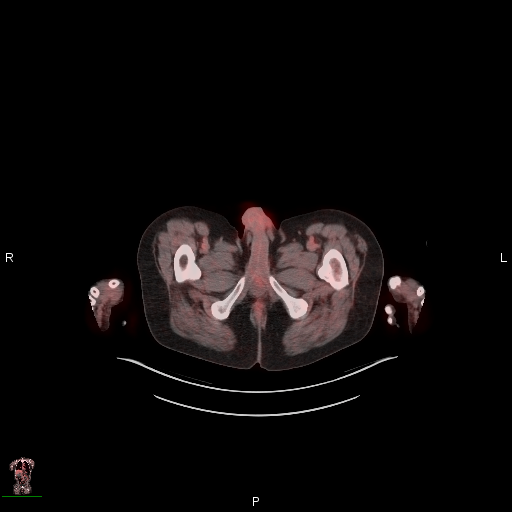
[im 142/164]
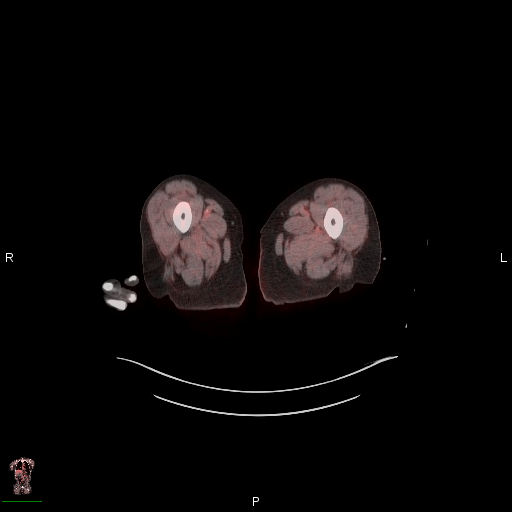
[im 153/164]
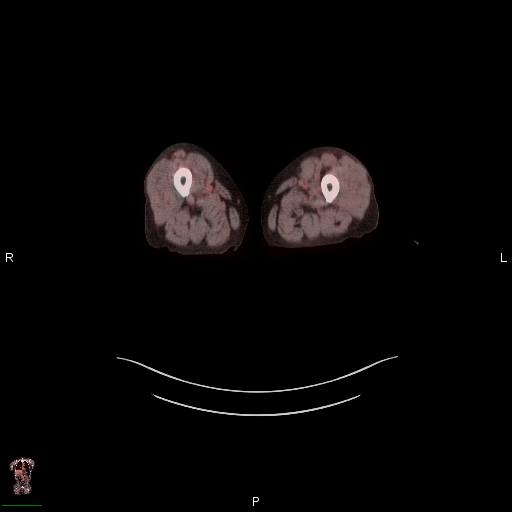

[coronal ct wb fusion · 4 of 37 slices shown]
[im 1/37]
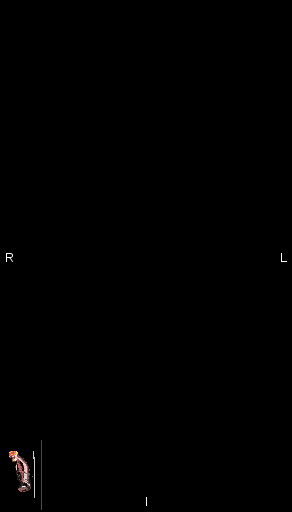
[im 13/37]
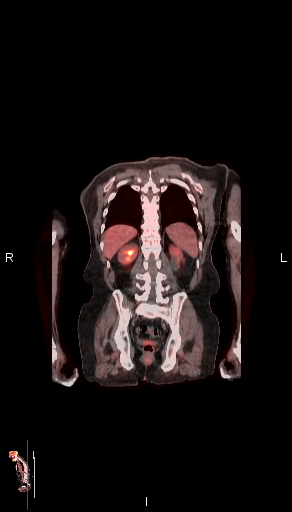
[im 25/37]
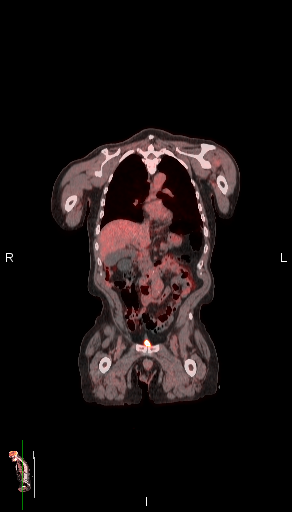
[im 37/37]
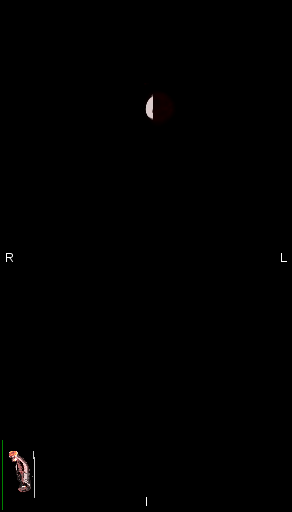

[22 of 25 positions shown; findings below may reference images not displayed]

FINDINGS: Mediastinal blood pool activity: SUV max

NECK: The necrotic mass centered at the right level 2 has enlarged
and is inseparable from the right palatine tonsil lesion. This
conglomerate measures on the order of 7.9 x 5.9 cm and a S.U.V. max
of 20.0 on image 59/3. Areas of relative central photopenia
indicative of necrosis. Extends into the right submandibular space
including on image 65/3.

The left-sided level 2 node from the prior diagnostic CT measures 6
mm and a S.U.V. max of 3.0 on image 63/3. Decreased from 11 mm.

Hypermetabolism about the anterior larynx is likely physiologic at a
S.U.V. max of 9.4.

Incidental CT findings: none

CHEST: No thoracic nodal hypermetabolism. A right lower lobe
pulmonary nodule measures 9 mm and a S.U.V. max of 3.4 on image
134/3. New since the prior chest CT.

Incidental CT findings: moderate centrilobular emphysema. A left
lower lobe 7 mm nodule on image 122/3 is also new. A vague 4 mm
nodule is suspected on image 123/3 in the right lower lobe. Lad
coronary artery calcification.

ABDOMEN/PELVIS: No abdominopelvic parenchymal or nodal
hypermetabolism.

Incidental CT findings: Low-density bilateral renal lesions. Normal
adrenal glands. Well-circumscribed low-density liver lesions are
likely cysts. Abdominal aortic atherosclerosis. Infrarenal abdominal
aortic ectasia at 3.4 cm as detailed previously.

SKELETON: Medial left eleventh rib hypermetabolism ( a S.U.V. max of
4.2) corresponds to a subtle fracture on image 148/3. This is new
since 12/03/2018. No underlying lytic lesion on the prior CT.

Incidental CT findings: Bilateral L5 pars defects.
IMPRESSION: 1. Progression of right palatine tonsil primary and adjacent nodal
metastasis. Conglomerate hypermetabolic mass with extension into the
right submandibular space. The left level 2 node is decreased in
size since the prior CT.
2. Development of bilateral pulmonary nodules, including a
hypermetabolic right lower lobe nodule. Highly suspicious for
pulmonary metastasis.
3. New eleventh medial left rib fracture with hypermetabolism. This
could be posttraumatic. If no interval trauma or excess coughing,
pathologic fracture would be a concern.
4. Aortic atherosclerosis ([id number]), coronary artery
atherosclerosis and emphysema ([id number].P).
